# Patient Record
Sex: Female | Born: 1950
Health system: Southern US, Community
[De-identification: ages and names within clinical notes are randomized; demographics above are authoritative.]

## PROBLEM LIST (undated history)

## (undated) DIAGNOSIS — F419 Anxiety disorder, unspecified: Secondary | ICD-10-CM

## (undated) DIAGNOSIS — I10 Essential (primary) hypertension: Secondary | ICD-10-CM

## (undated) HISTORY — PX: TUBAL LIGATION: SHX77

## (undated) HISTORY — DX: Essential (primary) hypertension: I10

## (undated) HISTORY — DX: Anxiety disorder, unspecified: F41.9

---

## 2003-03-12 ENCOUNTER — Emergency Department (HOSPITAL_COMMUNITY): Admission: EM | Admit: 2003-03-12 | Discharge: 2003-03-12 | Payer: Self-pay | Admitting: Emergency Medicine

## 2004-12-07 ENCOUNTER — Ambulatory Visit: Payer: Self-pay | Admitting: Family Medicine

## 2005-03-04 ENCOUNTER — Ambulatory Visit: Payer: Self-pay | Admitting: Family Medicine

## 2005-10-10 ENCOUNTER — Ambulatory Visit: Payer: Self-pay | Admitting: Family Medicine

## 2005-11-09 ENCOUNTER — Ambulatory Visit: Payer: Self-pay | Admitting: Family Medicine

## 2005-11-16 ENCOUNTER — Ambulatory Visit: Payer: Self-pay | Admitting: Family Medicine

## 2005-11-30 ENCOUNTER — Ambulatory Visit: Payer: Self-pay | Admitting: Internal Medicine

## 2005-12-14 ENCOUNTER — Ambulatory Visit: Payer: Self-pay | Admitting: Family Medicine

## 2006-01-18 ENCOUNTER — Ambulatory Visit (HOSPITAL_COMMUNITY): Admission: RE | Admit: 2006-01-18 | Discharge: 2006-01-18 | Payer: Self-pay | Admitting: Neurology

## 2006-02-03 ENCOUNTER — Ambulatory Visit: Payer: Self-pay | Admitting: Family Medicine

## 2007-01-05 ENCOUNTER — Ambulatory Visit: Payer: Self-pay | Admitting: Family Medicine

## 2010-02-01 ENCOUNTER — Ambulatory Visit (HOSPITAL_COMMUNITY): Admission: RE | Admit: 2010-02-01 | Discharge: 2010-02-01 | Payer: Self-pay | Admitting: Family Medicine

## 2010-10-18 ENCOUNTER — Encounter: Payer: Self-pay | Admitting: Family Medicine

## 2018-10-15 DIAGNOSIS — I10 Essential (primary) hypertension: Secondary | ICD-10-CM | POA: Insufficient documentation

## 2018-10-16 ENCOUNTER — Ambulatory Visit (INDEPENDENT_AMBULATORY_CARE_PROVIDER_SITE_OTHER): Payer: Medicare Other | Admitting: Family Medicine

## 2018-10-16 ENCOUNTER — Encounter (INDEPENDENT_AMBULATORY_CARE_PROVIDER_SITE_OTHER): Payer: Self-pay | Admitting: *Deleted

## 2018-10-16 ENCOUNTER — Ambulatory Visit (INDEPENDENT_AMBULATORY_CARE_PROVIDER_SITE_OTHER): Payer: Medicare Other

## 2018-10-16 ENCOUNTER — Encounter: Payer: Self-pay | Admitting: Family Medicine

## 2018-10-16 ENCOUNTER — Ambulatory Visit: Payer: Medicare Other

## 2018-10-16 VITALS — BP 164/95 | HR 88 | Temp 98.0°F | Ht 67.0 in | Wt 85.5 lb

## 2018-10-16 DIAGNOSIS — R634 Abnormal weight loss: Secondary | ICD-10-CM

## 2018-10-16 DIAGNOSIS — R0600 Dyspnea, unspecified: Secondary | ICD-10-CM | POA: Diagnosis not present

## 2018-10-16 DIAGNOSIS — R0602 Shortness of breath: Secondary | ICD-10-CM

## 2018-10-16 DIAGNOSIS — F17210 Nicotine dependence, cigarettes, uncomplicated: Secondary | ICD-10-CM | POA: Insufficient documentation

## 2018-10-16 DIAGNOSIS — Z124 Encounter for screening for malignant neoplasm of cervix: Secondary | ICD-10-CM | POA: Diagnosis not present

## 2018-10-16 DIAGNOSIS — Z780 Asymptomatic menopausal state: Secondary | ICD-10-CM

## 2018-10-16 DIAGNOSIS — R636 Underweight: Secondary | ICD-10-CM | POA: Diagnosis not present

## 2018-10-16 DIAGNOSIS — N760 Acute vaginitis: Secondary | ICD-10-CM | POA: Diagnosis not present

## 2018-10-16 DIAGNOSIS — F17200 Nicotine dependence, unspecified, uncomplicated: Secondary | ICD-10-CM | POA: Insufficient documentation

## 2018-10-16 DIAGNOSIS — Z1211 Encounter for screening for malignant neoplasm of colon: Secondary | ICD-10-CM | POA: Diagnosis not present

## 2018-10-16 DIAGNOSIS — N898 Other specified noninflammatory disorders of vagina: Secondary | ICD-10-CM

## 2018-10-16 DIAGNOSIS — Z01411 Encounter for gynecological examination (general) (routine) with abnormal findings: Secondary | ICD-10-CM | POA: Diagnosis not present

## 2018-10-16 DIAGNOSIS — Z7289 Other problems related to lifestyle: Secondary | ICD-10-CM | POA: Diagnosis not present

## 2018-10-16 DIAGNOSIS — Z7689 Persons encountering health services in other specified circumstances: Secondary | ICD-10-CM

## 2018-10-16 DIAGNOSIS — Z1212 Encounter for screening for malignant neoplasm of rectum: Secondary | ICD-10-CM

## 2018-10-16 DIAGNOSIS — Z1239 Encounter for other screening for malignant neoplasm of breast: Secondary | ICD-10-CM | POA: Diagnosis not present

## 2018-10-16 DIAGNOSIS — I10 Essential (primary) hypertension: Secondary | ICD-10-CM | POA: Diagnosis not present

## 2018-10-16 DIAGNOSIS — B9689 Other specified bacterial agents as the cause of diseases classified elsewhere: Secondary | ICD-10-CM

## 2018-10-16 DIAGNOSIS — Z13820 Encounter for screening for osteoporosis: Secondary | ICD-10-CM | POA: Diagnosis not present

## 2018-10-16 LAB — WET PREP FOR TRICH, YEAST, CLUE
CLUE CELL EXAM: POSITIVE — AB
TRICHOMONAS EXAM: NEGATIVE
YEAST EXAM: NEGATIVE

## 2018-10-16 MED ORDER — METRONIDAZOLE 500 MG PO TABS: 500.0000 mg | ORAL_TABLET | Freq: Two times a day (BID) | ORAL | 0 refills | Status: AC

## 2018-10-16 MED ORDER — LISINOPRIL 20 MG PO TABS: 20.0000 mg | ORAL_TABLET | Freq: Every day | ORAL | 3 refills | Status: DC

## 2018-10-16 NOTE — Patient Instructions (Signed)

## 2018-10-16 NOTE — Progress Notes (Addendum)
Subjective:    Patient ID: Elizabeth Dickerson, female    DOB: 1951/07/04, 68 y.o.   MRN: [id number]  Chief Complaint:  Shortness of Breath (ongoing for the last two years, has been a smoker for 30 years, worse with exertion) and Headache (having back headaches for about 6 months, has been off of BP medications for over a year)   HPI: TNYA ADES is a 68 y.o. female presenting on 10/16/2018 for Shortness of Breath (ongoing for the last two years, has been a smoker for 30 years, worse with exertion) and Headache (having back headaches for about 6 months, has been off of BP medications for over a year)  Pt presents today to establish care. Pt states she has not seen a PCP in several years. States she has been out of her blood pressure medications for over a year. States after stopping her blood pressure medications she developed a headache. States the headache is intermittent and aching in nature. States the pain is mostly in the left side. She denies confusion, focal neuro deficits, weakness, or visual disturbances. She does not have nausea or vomiting with the headaches. She denies chest pain, palpitations, or leg swelling. States she does have intermittent shortness of breath, states this is worse with exertion. She is a current every day smoker. She states she has lost around 30-40 pounds in the last year. States she does not know why she has lost this much weight. She denies fatigue, night sweats, fever, chills, or hemoptysis. She denies recent sick exposures. She reports vaginal discharge for several weeks, states she has pruritis with the discharge. She denies vaginal bleeding or dysuria. She states she has tried over the counter yeast treatments without success.   Relevant past medical, surgical, family, and social history reviewed and updated as indicated.  Allergies and medications reviewed and updated.   Past Medical History:  Diagnosis Date  . Hypertension     Past Surgical History:   Procedure Laterality Date  . TUBAL LIGATION      Social History   Socioeconomic History  . Marital status: Married    Spouse name: Not on file  . Number of children: Not on file  . Years of education: Not on file  . Highest education level: Not on file  Occupational History  . Not on file  Social Needs  . Financial resource strain: Not on file  . Food insecurity:    Worry: Not on file    Inability: Not on file  . Transportation needs:    Medical: Not on file    Non-medical: Not on file  Tobacco Use  . Smoking status: Current Every Day Smoker    Packs/day: 0.25    Years: 30.00    Pack years: 7.50    Types: Cigarettes  . Smokeless tobacco: Never Used  Substance and Sexual Activity  . Alcohol use: Not Currently  . Drug use: Yes    Types: Marijuana  . Sexual activity: Not on file  Lifestyle  . Physical activity:    Days per week: Not on file    Minutes per session: Not on file  . Stress: Not on file  Relationships  . Social connections:    Talks on phone: Not on file    Gets together: Not on file    Attends religious service: Not on file    Active member of club or organization: Not on file    Attends meetings of clubs or organizations:  Not on file    Relationship status: Not on file  . Intimate partner violence:    Fear of current or ex partner: Not on file    Emotionally abused: Not on file    Physically abused: Not on file    Forced sexual activity: Not on file  Other Topics Concern  . Not on file  Social History Narrative  . Not on file    Outpatient Encounter Medications as of 10/16/2018  Medication Sig  . lisinopril (PRINIVIL,ZESTRIL) 20 MG tablet Take 1 tablet (20 mg total) by mouth daily.   No facility-administered encounter medications on file as of 10/16/2018.     No Known Allergies  Review of Systems  Constitutional: Positive for unexpected weight change. Negative for activity change, appetite change, chills, fatigue and fever.  HENT:  Negative for nosebleeds, sore throat, trouble swallowing and voice change.   Eyes: Negative for photophobia and visual disturbance.  Respiratory: Positive for cough and shortness of breath. Negative for chest tightness and wheezing.   Cardiovascular: Negative for chest pain, palpitations and leg swelling.  Gastrointestinal: Negative for abdominal distention, abdominal pain, anal bleeding, blood in stool, constipation, diarrhea, nausea, rectal pain and vomiting.  Genitourinary: Positive for vaginal discharge. Negative for decreased urine volume, difficulty urinating, dysuria, flank pain, genital sores, hematuria, pelvic pain, vaginal bleeding and vaginal pain.  Neurological: Positive for headaches. Negative for dizziness, tremors, seizures, syncope, facial asymmetry, speech difficulty, weakness, light-headedness and numbness.  Psychiatric/Behavioral: Negative for confusion.  All other systems reviewed and are negative.       Objective:    BP (!) 164/95   Pulse 88   Temp 98 F (36.7 C) (Oral)   Ht 5' 7"  (1.702 m)   Wt 85 lb 8 oz (38.8 kg)   SpO2 99%   BMI 13.39 kg/m    Wt Readings from Last 3 Encounters:  10/16/18 85 lb 8 oz (38.8 kg)    Physical Exam Vitals signs and nursing note reviewed. Exam conducted with a chaperone present.  Constitutional:      General: She is not in acute distress.    Appearance: Normal appearance. She is well-groomed and underweight. She is not ill-appearing or toxic-appearing.  HENT:     Head: Normocephalic and atraumatic.     Jaw: There is normal jaw occlusion.     Right Ear: Hearing, tympanic membrane, ear canal and external ear normal.     Left Ear: Hearing, tympanic membrane, ear canal and external ear normal.     Nose: Nose normal.     Mouth/Throat:     Lips: Pink.     Mouth: Mucous membranes are moist.     Pharynx: Oropharynx is clear. Uvula midline.  Eyes:     General: Lids are normal.     Extraocular Movements: Extraocular movements  intact.     Conjunctiva/sclera: Conjunctivae normal.     Pupils: Pupils are equal, round, and reactive to light.     Visual Fields: Right eye visual fields normal and left eye visual fields normal.  Neck:     Musculoskeletal: Full passive range of motion without pain and neck supple.     Thyroid: No thyroid mass, thyromegaly or thyroid tenderness.     Vascular: No carotid bruit or JVD.     Trachea: Trachea and phonation normal.  Cardiovascular:     Rate and Rhythm: Normal rate and regular rhythm.     Chest Wall: PMI is not displaced.     Pulses:  Normal pulses.     Heart sounds: Normal heart sounds. No murmur. No friction rub. No gallop.   Pulmonary:     Effort: Pulmonary effort is normal. No respiratory distress.     Breath sounds: Normal breath sounds.  Chest:     Breasts: Breasts are symmetrical.        Right: Normal.        Left: Normal.  Abdominal:     General: Abdomen is flat. Bowel sounds are normal. There is no distension or abdominal bruit.     Palpations: Abdomen is soft.     Tenderness: There is no abdominal tenderness. There is no right CVA tenderness or left CVA tenderness.     Hernia: No hernia is present. There is no hernia in the right inguinal area or left inguinal area.  Genitourinary:    Exam position: Lithotomy position.     Pubic Area: No rash or pubic lice.      Labia:        Right: No rash, tenderness, lesion or injury.        Left: No rash, tenderness, lesion or injury.      Urethra: No prolapse, urethral pain, urethral swelling or urethral lesion.     Vagina: Vaginal discharge (homogenous malodorous white to ligth gray) present.     Cervix: Discharge and erythema present. No cervical motion tenderness, friability or cervical bleeding.     Uterus: Normal.      Adnexa: Right adnexa normal and left adnexa normal.     Rectum: Normal.  Musculoskeletal: Normal range of motion.     Right lower leg: No edema.     Left lower leg: No edema.  Lymphadenopathy:      Cervical: No cervical adenopathy.     Upper Body:     Right upper body: No supraclavicular, axillary or pectoral adenopathy.     Left upper body: No supraclavicular, axillary or pectoral adenopathy.     Lower Body: No right inguinal adenopathy. No left inguinal adenopathy.  Skin:    General: Skin is warm and dry.     Capillary Refill: Capillary refill takes less than 2 seconds.  Neurological:     General: No focal deficit present.     Mental Status: She is alert and oriented to person, place, and time.     Cranial Nerves: Cranial nerves are intact.     Sensory: Sensation is intact.     Motor: Motor function is intact.     Coordination: Coordination is intact.     Gait: Gait is intact.     Deep Tendon Reflexes: Reflexes are normal and symmetric.  Psychiatric:        Attention and Perception: Attention and perception normal.        Mood and Affect: Mood and affect normal.        Speech: Speech normal.        Behavior: Behavior normal. Behavior is cooperative.        Thought Content: Thought content normal.        Cognition and Memory: Cognition and memory normal.        Judgment: Judgment normal.     No results found for this or any previous visit.    EKG: there are no previous tracings available for comparison, SR with BBB, no ectopy or ST elevation. Monia Pouch, FNP-C  X-Ray: Chest: No acute findings. Preliminary x-ray reading by Monia Pouch, FNP-C, [facility].  Pertinent labs & imaging results that were  available during my care of the patient were reviewed by me and considered in my medical decision making.  Assessment & Plan:  Kaitlen was seen today for shortness of breath and headache.  Diagnoses and all orders for this visit:  Encounter to establish care  Shortness of breath Shortness of breath for 2 years, worsening over the last few months and worse with exertion. Labs, EKG, and chest xray today.  -     EKG 12-Lead -     CMP14+EGFR -     CBC with  Differential/Platelet -     TSH -     Microalbumin / creatinine urine ratio -     DG Chest 2 View; Future  Essential hypertension Has been out of blood pressure medications for over a year. Increased blood pressure and headaches. Labs pending. Lisinopril refilled. Follow up in 3 months. Report any new or worsening symptoms.  -     CMP14+EGFR -     CBC with Differential/Platelet -     Lipid panel -     TSH -     Microalbumin / creatinine urine ratio -     lisinopril (PRINIVIL,ZESTRIL) 20 MG tablet; Take 1 tablet (20 mg total) by mouth daily.  Other problems related to lifestyle -     Hepatitis C antibody  Screening breast examination Will schedule mammogram. Order placed today.  Screening for colorectal cancer Denies previous colonoscopy. Referral placed today.  -     Ambulatory referral to Gastroenterology  Screening for cervical cancer Unsure of last PAP. Will complete today.   Encounter for osteoporosis screening in asymptomatic postmenopausal patient Menopause 25 years ago. Does not exercise on a regular basis. No adequate calcium intake. Has not had a DEXA in the past, order placed today.  -     DG [facility] DEXA  Vaginal discharge Wet prep positive for clue cells, negative for yeast and trich. Vaginal hygiene discussed. Pt aware to discontinue over the counter yeast treatments.  -     WET PREP FOR TRICH, YEAST, CLUE  Weight loss Labs pending and chest xray ordered.  -     TSH -     DG Chest 2 View; Future  Tobacco dependence Smoking cessation encouraged. Shortness of breath and weight loss. Screening chest xray today.  -     DG Chest 2 View; Future  Bacterial vaginosis Vaginal hygiene discussed. Medications as prescribed. Report any new or worsening symptoms.         - Flagyl 500 mg oral, twice daily for 7 days.   Total time spent with patient 60 minutes.  Greater than 50% of encounter spent in coordination of care/counseling.  Continue all other maintenance  medications.  Follow up plan: Return in about 3 months (around 01/15/2019), or if symptoms worsen or fail to improve.  Educational handout given for health maintenance  The above assessment and management plan was discussed with the patient. The patient verbalized understanding of and has agreed to the management plan. Patient is aware to call the clinic if symptoms persist or worsen. Patient is aware when to return to the clinic for a follow-up visit. Patient educated on when it is appropriate to go to the emergency department.   Monia Pouch, FNP-C [facility] [phone]

## 2018-10-16 NOTE — Addendum Note (Signed)
Addended by: Michaela Corner on: 10/16/2018 02:41 PM   Modules accepted: Orders

## 2018-10-17 LAB — CMP14+EGFR
ALK PHOS: 113 IU/L (ref 39–117)
ALT: 11 IU/L (ref 0–32)
AST: 24 IU/L (ref 0–40)
Albumin/Globulin Ratio: 1.7 (ref 1.2–2.2)
Albumin: 4.8 g/dL (ref 3.8–4.8)
BUN/Creatinine Ratio: 17 (ref 12–28)
BUN: 17 mg/dL (ref 8–27)
Bilirubin Total: 0.3 mg/dL (ref 0.0–1.2)
CO2: 22 mmol/L (ref 20–29)
CREATININE: 1 mg/dL (ref 0.57–1.00)
Calcium: 9.6 mg/dL (ref 8.7–10.3)
Chloride: 104 mmol/L (ref 96–106)
GFR calc Af Amer: 68 mL/min/{1.73_m2} (ref >59)
GFR calc non Af Amer: 59 mL/min/{1.73_m2} — ABNORMAL LOW (ref >59)
GLUCOSE: 88 mg/dL (ref 65–99)
Globulin, Total: 2.9 g/dL (ref 1.5–4.5)
Potassium: 4.2 mmol/L (ref 3.5–5.2)
Sodium: 145 mmol/L — ABNORMAL HIGH (ref 134–144)
TOTAL PROTEIN: 7.7 g/dL (ref 6.0–8.5)

## 2018-10-17 LAB — CBC WITH DIFFERENTIAL/PLATELET
Basophils Absolute: 0.1 10*3/uL (ref 0.0–0.2)
Basos: 1 %
EOS (ABSOLUTE): 0.1 10*3/uL (ref 0.0–0.4)
EOS: 2 %
HEMOGLOBIN: 13.4 g/dL (ref 11.1–15.9)
Hematocrit: 40.2 % (ref 34.0–46.6)
IMMATURE GRANS (ABS): 0 10*3/uL (ref 0.0–0.1)
IMMATURE GRANULOCYTES: 0 %
LYMPHS: 41 %
Lymphocytes Absolute: 2.6 10*3/uL (ref 0.7–3.1)
MCH: 27.6 pg (ref 26.6–33.0)
MCHC: 33.3 g/dL (ref 31.5–35.7)
MCV: 83 fL (ref 79–97)
MONOCYTES: 5 %
MONOS ABS: 0.3 10*3/uL (ref 0.1–0.9)
NEUTROS PCT: 51 %
Neutrophils Absolute: 3.2 10*3/uL (ref 1.4–7.0)
Platelets: 342 10*3/uL (ref 150–450)
RBC: 4.86 x10E6/uL (ref 3.77–5.28)
RDW: 13.8 % (ref 11.7–15.4)
WBC: 6.3 10*3/uL (ref 3.4–10.8)

## 2018-10-17 LAB — LIPID PANEL
CHOL/HDL RATIO: 3.6 ratio (ref 0.0–4.4)
Cholesterol, Total: 201 mg/dL — ABNORMAL HIGH (ref 100–199)
HDL: 56 mg/dL (ref >39)
LDL Calculated: 130 mg/dL — ABNORMAL HIGH (ref 0–99)
TRIGLYCERIDES: 74 mg/dL (ref 0–149)
VLDL Cholesterol Cal: 15 mg/dL (ref 5–40)

## 2018-10-17 LAB — HEPATITIS C ANTIBODY: Hep C Virus Ab: <0.1 s/co ratio (ref 0.0–0.9)

## 2018-10-17 LAB — TSH: TSH: 0.566 u[IU]/mL (ref 0.450–4.500)

## 2018-10-17 LAB — MICROALBUMIN / CREATININE URINE RATIO
Creatinine, Urine: 164 mg/dL
MICROALB/CREAT RATIO: 42 mg/g{creat} — AB (ref 0–29)
Microalbumin, Urine: 69.5 ug/mL

## 2018-10-19 LAB — PAP IG W/ RFLX HPV ASCU

## 2019-02-05 ENCOUNTER — Telehealth: Payer: Self-pay | Admitting: *Deleted

## 2019-02-05 NOTE — Telephone Encounter (Signed)
Left message for patient to call back to set up follow up appt with Monia Pouch

## 2019-04-16 ENCOUNTER — Encounter: Payer: Self-pay | Admitting: Family Medicine

## 2019-04-16 ENCOUNTER — Other Ambulatory Visit: Payer: Self-pay

## 2019-04-16 ENCOUNTER — Ambulatory Visit (INDEPENDENT_AMBULATORY_CARE_PROVIDER_SITE_OTHER): Payer: Medicare HMO | Admitting: Family Medicine

## 2019-04-16 DIAGNOSIS — F411 Generalized anxiety disorder: Secondary | ICD-10-CM | POA: Diagnosis not present

## 2019-04-16 DIAGNOSIS — F339 Major depressive disorder, recurrent, unspecified: Secondary | ICD-10-CM | POA: Diagnosis not present

## 2019-04-16 DIAGNOSIS — R69 Illness, unspecified: Secondary | ICD-10-CM | POA: Diagnosis not present

## 2019-04-16 MED ORDER — HYDROXYZINE HCL 10 MG PO TABS: 10.0000 mg | ORAL_TABLET | Freq: Three times a day (TID) | ORAL | 0 refills | Status: DC | PRN

## 2019-04-16 MED ORDER — ESCITALOPRAM OXALATE 5 MG PO TABS: 5.0000 mg | ORAL_TABLET | Freq: Every day | ORAL | 3 refills | Status: DC

## 2019-04-16 NOTE — Progress Notes (Signed)
Virtual Visit via telephone Note Due to COVID-19 pandemic this visit was conducted virtually. This visit type was conducted due to national recommendations for restrictions regarding the COVID-19 Pandemic (e.g. social distancing, sheltering in place) in an effort to limit this patient's exposure and mitigate transmission in our community. All issues noted in this document were discussed and addressed.  A physical exam was not performed with this format.   I connected with Elizabeth Dickerson on 04/16/19 at 1505 by telephone and verified that I am speaking with the correct person using two identifiers. Elizabeth Dickerson is currently located at home and family is currently with them during visit. The provider, Monia Pouch, FNP is located in their office at time of visit.  I discussed the limitations, risks, security and privacy concerns of performing an evaluation and management service by telephone and the availability of in person appointments. I also discussed with the patient that there may be a patient responsible charge related to this service. The patient expressed understanding and agreed to proceed.  Subjective:  Patient ID: Elizabeth Dickerson, female    DOB: Oct 04, 1950, 68 y.o.   MRN: [id number]  Chief Complaint:  Anxiety   HPI: Elizabeth Dickerson is a 68 y.o. female presenting on 04/16/2019 for Anxiety   Pt reports increasing anxiety and depression since the onset of the COVID-19 pandemic. Pt states she has severe anxiety when she has to go to the store or out in public. States it is almost to the point of panic. Pt states her depression seems to be bothering her more also. States she has some days where she does not want to get out of the bed. Pt states she has been on "blues" before for her anxiety and feels she needs this at this time. Pt has never tried daily medications for her anxiety or depression.   Anxiety Presents for initial visit. Onset was 6 to 12 months ago. The problem has been  waxing and waning. Symptoms include decreased concentration, depressed mood, dizziness, dry mouth, excessive worry, insomnia, irritability, nervous/anxious behavior, palpitations and panic. Patient reports no chest pain, compulsions, confusion, feeling of choking, hyperventilation, impotence, malaise, muscle tension, nausea, obsessions, restlessness, shortness of breath or suicidal ideas. Symptoms occur occasionally. The severity of symptoms is moderate. The symptoms are aggravated by social activities. The quality of sleep is fair. Nighttime awakenings: occasional.   Past treatments include benzodiazephines. Compliance with prior treatments has been good.     Relevant past medical, surgical, family, and social history reviewed and updated as indicated.  Allergies and medications reviewed and updated.   Past Medical History:  Diagnosis Date  . Hypertension     Past Surgical History:  Procedure Laterality Date  . TUBAL LIGATION      Social History   Socioeconomic History  . Marital status: Married    Spouse name: Not on file  . Number of children: Not on file  . Years of education: Not on file  . Highest education level: Not on file  Occupational History  . Not on file  Social Needs  . Financial resource strain: Not on file  . Food insecurity    Worry: Not on file    Inability: Not on file  . Transportation needs    Medical: Not on file    Non-medical: Not on file  Tobacco Use  . Smoking status: Current Every Day Smoker    Packs/day: 0.25    Years: 30.00    Pack years:  7.50    Types: Cigarettes  . Smokeless tobacco: Never Used  Substance and Sexual Activity  . Alcohol use: Not Currently  . Drug use: Yes    Types: Marijuana  . Sexual activity: Not on file  Lifestyle  . Physical activity    Days per week: Not on file    Minutes per session: Not on file  . Stress: Not on file  Relationships  . Social Herbalist on phone: Not on file    Gets together:  Not on file    Attends religious service: Not on file    Active member of club or organization: Not on file    Attends meetings of clubs or organizations: Not on file    Relationship status: Not on file  . Intimate partner violence    Fear of current or ex partner: Not on file    Emotionally abused: Not on file    Physically abused: Not on file    Forced sexual activity: Not on file  Other Topics Concern  . Not on file  Social History Narrative  . Not on file    Outpatient Encounter Medications as of 04/16/2019  Medication Sig  . escitalopram (LEXAPRO) 5 MG tablet Take 1 tablet (5 mg total) by mouth daily.  . hydrOXYzine (ATARAX/VISTARIL) 10 MG tablet Take 1 tablet (10 mg total) by mouth 3 (three) times daily as needed.  Marland Kitchen lisinopril (PRINIVIL,ZESTRIL) 20 MG tablet Take 1 tablet (20 mg total) by mouth daily.   No facility-administered encounter medications on file as of 04/16/2019.     No Known Allergies  Review of Systems  Constitutional: Positive for activity change, fatigue and irritability. Negative for appetite change, chills, diaphoresis, fever and unexpected weight change.  Respiratory: Negative for cough, chest tightness, shortness of breath and wheezing.   Cardiovascular: Positive for palpitations. Negative for chest pain and leg swelling.  Gastrointestinal: Negative for abdominal pain, diarrhea, nausea and vomiting.  Genitourinary: Negative for impotence.  Musculoskeletal: Negative for arthralgias and myalgias.  Neurological: Positive for dizziness. Negative for syncope, weakness, light-headedness and headaches.  Psychiatric/Behavioral: Positive for agitation, decreased concentration, dysphoric mood and sleep disturbance. Negative for behavioral problems, confusion, hallucinations, self-injury and suicidal ideas. The patient is nervous/anxious and has insomnia. The patient is not hyperactive.   All other systems reviewed and are negative.         Observations/Objective: No vital signs or physical exam, this was a telephone or virtual health encounter.  Pt alert and oriented, answers all questions appropriately, and able to speak in full sentences.    Assessment and Plan: Inna was seen today for anxiety.  Diagnoses and all orders for this visit:  GAD (generalized anxiety disorder) Ongoing and worsening anxiety. Severe when in public. Will initiate daily Lexapro and PRN atarax. Sedation precautions for Atarax provided. Pt requesting Xanax. Pt aware this is not the first line treatment for anxiety and not appropriate for use in the elder population. Pt aware of the importance of medication compliance. Follow up in 4 weeks for reevaluation.  -     hydrOXYzine (ATARAX/VISTARIL) 10 MG tablet; Take 1 tablet (10 mg total) by mouth 3 (three) times daily as needed. -     escitalopram (LEXAPRO) 5 MG tablet; Take 1 tablet (5 mg total) by mouth daily.  Depression, recurrent ([place]) Ongoing and worsening depression. Will initiate Lexapro daily. Pt aware to report any new or worsening symptoms. Follow up in 4 weeks for reevaluation.  -  escitalopram (LEXAPRO) 5 MG tablet; Take 1 tablet (5 mg total) by mouth daily.     Follow Up Instructions: Return in about 4 weeks (around 05/14/2019), or if symptoms worsen or fail to improve, for anxiety.    I discussed the assessment and treatment plan with the patient. The patient was provided an opportunity to ask questions and all were answered. The patient agreed with the plan and demonstrated an understanding of the instructions.   The patient was advised to call back or seek an in-person evaluation if the symptoms worsen or if the condition fails to improve as anticipated.  The above assessment and management plan was discussed with the patient. The patient verbalized understanding of and has agreed to the management plan. Patient is aware to call the clinic if symptoms persist or worsen. Patient is  aware when to return to the clinic for a follow-up visit. Patient educated on when it is appropriate to go to the emergency department.    I provided 25 minutes of non-face-to-face time during this encounter. The call started at 1505. The call ended at 1530. The other time was used for coordination of care.    Monia Pouch, FNP-C [facility] [address] [place], [place] [address] [phone] 04/16/19

## 2019-10-28 ENCOUNTER — Ambulatory Visit (INDEPENDENT_AMBULATORY_CARE_PROVIDER_SITE_OTHER): Payer: Medicare HMO | Admitting: Family Medicine

## 2019-10-28 ENCOUNTER — Other Ambulatory Visit: Payer: Self-pay

## 2019-10-28 ENCOUNTER — Ambulatory Visit (INDEPENDENT_AMBULATORY_CARE_PROVIDER_SITE_OTHER): Payer: Medicare HMO

## 2019-10-28 ENCOUNTER — Encounter: Payer: Self-pay | Admitting: Family Medicine

## 2019-10-28 VITALS — BP 160/94 | HR 98 | Temp 98.4°F | Resp 20 | Ht 67.0 in | Wt 82.0 lb

## 2019-10-28 DIAGNOSIS — M810 Age-related osteoporosis without current pathological fracture: Secondary | ICD-10-CM

## 2019-10-28 DIAGNOSIS — Z1231 Encounter for screening mammogram for malignant neoplasm of breast: Secondary | ICD-10-CM | POA: Diagnosis not present

## 2019-10-28 DIAGNOSIS — R69 Illness, unspecified: Secondary | ICD-10-CM | POA: Diagnosis not present

## 2019-10-28 DIAGNOSIS — J449 Chronic obstructive pulmonary disease, unspecified: Secondary | ICD-10-CM | POA: Diagnosis not present

## 2019-10-28 DIAGNOSIS — I10 Essential (primary) hypertension: Secondary | ICD-10-CM

## 2019-10-28 DIAGNOSIS — F411 Generalized anxiety disorder: Secondary | ICD-10-CM

## 2019-10-28 DIAGNOSIS — R634 Abnormal weight loss: Secondary | ICD-10-CM | POA: Diagnosis not present

## 2019-10-28 DIAGNOSIS — Z1212 Encounter for screening for malignant neoplasm of rectum: Secondary | ICD-10-CM

## 2019-10-28 DIAGNOSIS — Z13820 Encounter for screening for osteoporosis: Secondary | ICD-10-CM | POA: Diagnosis not present

## 2019-10-28 DIAGNOSIS — F17200 Nicotine dependence, unspecified, uncomplicated: Secondary | ICD-10-CM | POA: Diagnosis not present

## 2019-10-28 DIAGNOSIS — Z1211 Encounter for screening for malignant neoplasm of colon: Secondary | ICD-10-CM | POA: Diagnosis not present

## 2019-10-28 DIAGNOSIS — Z79899 Other long term (current) drug therapy: Secondary | ICD-10-CM | POA: Diagnosis not present

## 2019-10-28 DIAGNOSIS — K219 Gastro-esophageal reflux disease without esophagitis: Secondary | ICD-10-CM

## 2019-10-28 DIAGNOSIS — F339 Major depressive disorder, recurrent, unspecified: Secondary | ICD-10-CM

## 2019-10-28 DIAGNOSIS — M8588 Other specified disorders of bone density and structure, other site: Secondary | ICD-10-CM | POA: Diagnosis not present

## 2019-10-28 MED ORDER — OMEPRAZOLE 20 MG PO CPDR: 20.0000 mg | DELAYED_RELEASE_CAPSULE | Freq: Every day | ORAL | 3 refills | Status: DC

## 2019-10-28 MED ORDER — AMLODIPINE BESYLATE 5 MG PO TABS: 5.0000 mg | ORAL_TABLET | Freq: Every day | ORAL | 3 refills | Status: DC

## 2019-10-28 NOTE — Progress Notes (Signed)
Subjective:  Patient ID: Elizabeth Dickerson, female    DOB: 11-Dec-1950, 69 y.o.   MRN: [id number]  Patient Care Team: Baruch Gouty, FNP as PCP - General (Family Medicine)   Chief Complaint:  Weight Loss and Gastroesophageal Reflux   HPI: Elizabeth Dickerson is a 69 y.o. female presenting on 10/28/2019 for Weight Loss and Gastroesophageal Reflux   1. Essential hypertension Complaint with meds - Yes Current Medications - Norvasc, lisinopril  Checking BP at home - No Exercising Regularly - No Watching Salt intake - No Pertinent ROS:  Headache - No Fatigue - Yes Visual Disturbances - No Chest pain - No Dyspnea - No Palpitations - No LE edema - No They report good compliance with medications and can restate their regimen by memory. No medication side effects.  Family, social, and smoking history reviewed.   BP Readings from Last 3 Encounters:  10/28/19 (!) 160/94  10/16/18 (!) 164/95   CMP Latest Ref Rng & Units 10/16/2018  Glucose 65 - 99 mg/dL 88  BUN 8 - 27 mg/dL 17  Creatinine 0.57 - 1.00 mg/dL 1.00  Sodium 134 - 144 mmol/L 145(H)  Potassium 3.5 - 5.2 mmol/L 4.2  Chloride 96 - 106 mmol/L 104  CO2 20 - 29 mmol/L 22  Calcium 8.7 - 10.3 mg/dL 9.6  Total Protein 6.0 - 8.5 g/dL 7.7  Total Bilirubin 0.0 - 1.2 mg/dL 0.3  Alkaline Phos 39 - 117 IU/L 113  AST 0 - 40 IU/L 24  ALT 0 - 32 IU/L 11      2. Depression, recurrent ([place]) 3. GAD (generalized anxiety disorder) Pt has been taking Lexapro as prescribed as reports doing well. States she has increased anxiety if she has to go to [employer]. She denies the need for atarax. No SI or HI.  Depression screen [facility] 2/9 10/28/2019 10/28/2019 04/16/2019 10/16/2018  Decreased Interest 0 0 1 2  Down, Depressed, Hopeless 0 0 1 3  PHQ - 2 Score 0 0 2 5  Altered sleeping 0 - 1 2  Tired, decreased energy 0 - - 3  Change in appetite 0 - 0 3  Feeling bad or failure about yourself  0 - 0 1  Trouble concentrating 0 - 1 3  Moving slowly or  fidgety/restless 0 - 0 1  Suicidal thoughts 0 - 0 0  PHQ-9 Score 0 - 4 18    4. Weight loss Pt reports ongoing weight loss since 2016. Only recorded weight in EHR was from 2007 and was 120lbs. Most recent OV on 10/16/2018 with a weight of 38.8 kg. Pt states she does have fatigue with the weight loss. No fever, chills, weakness, or confusion. States she does not have a great appetite and does not eat much daily. States she only has 1-2 BM per week. States this has been her normal for several years. She denies melena or hematochezia. No hemoptysis, nausea, vomiting, or abdominal pain. Denies ETOH use at present. States she does smoke marijuana at times.   5. Tobacco dependence Current smoker, 7.5 pack years. Has cut back to 14 cigarettes per week. States this has been hard but she is working to quit all together. She does have a cough at times that is nonproductive.   6. Gastroesophageal reflux disease without esophagitis Compliant with medications - Yes Current medications - omeprazole Adverse side effects - No DEXA ordered today Cough - Yes at times Sore throat - No Voice change - No Hemoptysis -  No Dysphagia or dyspepsia - No Water brash - No Red Flags (weight loss, hematochezia, melena, weight loss, early satiety, fevers, odynophagia, or persistent vomiting) - yes, weight loss, early satiety    Relevant past medical, surgical, family, and social history reviewed and updated as indicated.  Allergies and medications reviewed and updated. Date reviewed: Chart in Epic.   Past Medical History:  Diagnosis Date  . Anxiety    panic attack   . Hypertension     Past Surgical History:  Procedure Laterality Date  . TUBAL LIGATION      Social History   Socioeconomic History  . Marital status: Married    Spouse name: Not on file  . Number of children: Not on file  . Years of education: Not on file  . Highest education level: Not on file  Occupational History  . Not on file    Tobacco Use  . Smoking status: Current Every Day Smoker    Packs/day: 0.25    Years: 30.00    Pack years: 7.50    Types: Cigarettes  . Smokeless tobacco: Never Used  Substance and Sexual Activity  . Alcohol use: Not Currently  . Drug use: Yes    Types: Marijuana  . Sexual activity: Not on file  Other Topics Concern  . Not on file  Social History Narrative  . Not on file   Social Determinants of Health   Financial Resource Strain:   . Difficulty of Paying Living Expenses: Not on file  Food Insecurity:   . Worried About Charity fundraiser in the Last Year: Not on file  . Ran Out of Food in the Last Year: Not on file  Transportation Needs:   . Lack of Transportation (Medical): Not on file  . Lack of Transportation (Non-Medical): Not on file  Physical Activity:   . Days of Exercise per Week: Not on file  . Minutes of Exercise per Session: Not on file  Stress:   . Feeling of Stress : Not on file  Social Connections:   . Frequency of Communication with Friends and Family: Not on file  . Frequency of Social Gatherings with Friends and Family: Not on file  . Attends Religious Services: Not on file  . Active Member of Clubs or Organizations: Not on file  . Attends Archivist Meetings: Not on file  . Marital Status: Not on file  Intimate Partner Violence:   . Fear of Current or Ex-Partner: Not on file  . Emotionally Abused: Not on file  . Physically Abused: Not on file  . Sexually Abused: Not on file    Outpatient Encounter Medications as of 10/28/2019  Medication Sig  . escitalopram (LEXAPRO) 5 MG tablet Take 1 tablet (5 mg total) by mouth daily.  Marland Kitchen lisinopril (PRINIVIL,ZESTRIL) 20 MG tablet Take 1 tablet (20 mg total) by mouth daily.  Marland Kitchen amLODipine (NORVASC) 5 MG tablet Take 1 tablet (5 mg total) by mouth daily.  . hydrOXYzine (ATARAX/VISTARIL) 10 MG tablet Take 1 tablet (10 mg total) by mouth 3 (three) times daily as needed. (Patient not taking: Reported on  10/28/2019)  . omeprazole (PRILOSEC) 20 MG capsule Take 1 capsule (20 mg total) by mouth daily.   No facility-administered encounter medications on file as of 10/28/2019.    No Known Allergies  Review of Systems  Constitutional: Positive for appetite change, fatigue and unexpected weight change. Negative for activity change, chills, diaphoresis and fever.  HENT: Negative.   Eyes: Negative.  Negative for photophobia and visual disturbance.  Respiratory: Negative for cough, chest tightness and shortness of breath.   Cardiovascular: Negative for chest pain, palpitations and leg swelling.  Gastrointestinal: Positive for constipation. Negative for abdominal distention, abdominal pain, anal bleeding, blood in stool, diarrhea, nausea, rectal pain and vomiting.  Endocrine: Negative.  Negative for cold intolerance, heat intolerance, polydipsia, polyphagia and polyuria.  Genitourinary: Negative for decreased urine volume, difficulty urinating, dysuria, flank pain, frequency, hematuria, urgency and vaginal bleeding.  Musculoskeletal: Negative for arthralgias and myalgias.  Skin: Negative.   Allergic/Immunologic: Negative.   Neurological: Negative for dizziness, tremors, seizures, syncope, facial asymmetry, speech difficulty, weakness, light-headedness, numbness and headaches.  Hematological: Negative.  Negative for adenopathy. Does not bruise/bleed easily.  Psychiatric/Behavioral: Negative for confusion, hallucinations, sleep disturbance and suicidal ideas.  All other systems reviewed and are negative.       Objective:  BP (!) 160/94 (BP Location: Left Arm, Cuff Size: Small)   Pulse 98   Temp 98.4 F (36.9 C)   Resp 20   Ht 5' 7"  (1.702 m)   Wt 82 lb (37.2 kg)   SpO2 99%   BMI 12.84 kg/m    Wt Readings from Last 3 Encounters:  10/28/19 82 lb (37.2 kg)  10/16/18 85 lb 8 oz (38.8 kg)    Physical Exam Vitals and nursing note reviewed.  Constitutional:      General: She is not in acute  distress.    Appearance: Normal appearance. She is well-developed, well-groomed and underweight. She is not ill-appearing, toxic-appearing or diaphoretic.  HENT:     Head: Normocephalic and atraumatic.     Jaw: There is normal jaw occlusion.     Right Ear: Hearing normal.     Left Ear: Hearing normal.     Nose: Nose normal.     Mouth/Throat:     Lips: Pink.     Mouth: Mucous membranes are moist.     Pharynx: Oropharynx is clear. Uvula midline.  Eyes:     General: Lids are normal.     Extraocular Movements: Extraocular movements intact.     Conjunctiva/sclera: Conjunctivae normal.     Pupils: Pupils are equal, round, and reactive to light.  Neck:     Thyroid: No thyroid mass, thyromegaly or thyroid tenderness.     Vascular: No carotid bruit or JVD.     Trachea: Trachea and phonation normal.  Cardiovascular:     Rate and Rhythm: Normal rate and regular rhythm.     Chest Wall: PMI is not displaced.     Pulses: Normal pulses.     Heart sounds: Normal heart sounds. No murmur. No friction rub. No gallop.   Pulmonary:     Effort: Pulmonary effort is normal. No respiratory distress.     Breath sounds: Normal breath sounds. No wheezing.  Abdominal:     General: Abdomen is flat. Bowel sounds are normal. There is no distension or abdominal bruit.     Palpations: Abdomen is soft. There is no hepatomegaly or splenomegaly.     Tenderness: There is no abdominal tenderness. There is no right CVA tenderness or left CVA tenderness.     Hernia: No hernia is present.  Musculoskeletal:        General: Normal range of motion.     Cervical back: Normal range of motion and neck supple.     Right lower leg: No edema.     Left lower leg: No edema.  Lymphadenopathy:  Cervical: No cervical adenopathy.  Skin:    General: Skin is warm and dry.     Capillary Refill: Capillary refill takes less than 2 seconds.     Coloration: Skin is not cyanotic, jaundiced or pale.     Findings: No rash.    Neurological:     General: No focal deficit present.     Mental Status: She is alert and oriented to person, place, and time.     Cranial Nerves: Cranial nerves are intact. No cranial nerve deficit.     Sensory: Sensation is intact. No sensory deficit.     Motor: Motor function is intact. No weakness.     Coordination: Coordination is intact. Coordination normal.     Gait: Gait is intact. Gait normal.     Deep Tendon Reflexes: Reflexes are normal and symmetric. Reflexes normal.  Psychiatric:        Attention and Perception: Attention and perception normal.        Mood and Affect: Mood and affect normal.        Speech: Speech normal.        Behavior: Behavior normal. Behavior is cooperative.        Thought Content: Thought content normal.        Cognition and Memory: Cognition and memory normal.        Judgment: Judgment normal.     Results for orders placed or performed in visit on 10/16/18  WET PREP FOR [place], YEAST, CLUE   Specimen: Vaginal Fluid   VAGINAL FLUI  Result Value Ref Range   Trichomonas Exam Negative Negative   Yeast Exam Negative Negative   Clue Cell Exam Positive (A) Negative  CMP14+EGFR  Result Value Ref Range   Glucose 88 65 - 99 mg/dL   BUN 17 8 - 27 mg/dL   Creatinine, Ser 1.00 0.57 - 1.00 mg/dL   GFR calc non Af Amer 59 (L) >59 mL/min/1.73   GFR calc Af Amer 68 >59 mL/min/1.73   BUN/Creatinine Ratio 17 12 - 28   Sodium 145 (H) 134 - 144 mmol/L   Potassium 4.2 3.5 - 5.2 mmol/L   Chloride 104 96 - 106 mmol/L   CO2 22 20 - 29 mmol/L   Calcium 9.6 8.7 - 10.3 mg/dL   Total Protein 7.7 6.0 - 8.5 g/dL   Albumin 4.8 3.8 - 4.8 g/dL   Globulin, Total 2.9 1.5 - 4.5 g/dL   Albumin/Globulin Ratio 1.7 1.2 - 2.2   Bilirubin Total 0.3 0.0 - 1.2 mg/dL   Alkaline Phosphatase 113 39 - 117 IU/L   AST 24 0 - 40 IU/L   ALT 11 0 - 32 IU/L  CBC with Differential/Platelet  Result Value Ref Range   WBC 6.3 3.4 - 10.8 x10E3/uL   RBC 4.86 3.77 - 5.28 x10E6/uL    Hemoglobin 13.4 11.1 - 15.9 g/dL   Hematocrit 40.2 34.0 - 46.6 %   MCV 83 79 - 97 fL   MCH 27.6 26.6 - 33.0 pg   MCHC 33.3 31.5 - 35.7 g/dL   RDW 13.8 11.7 - 15.4 %   Platelets 342 150 - 450 x10E3/uL   Neutrophils 51 Not Estab. %   Lymphs 41 Not Estab. %   Monocytes 5 Not Estab. %   Eos 2 Not Estab. %   Basos 1 Not Estab. %   Neutrophils Absolute 3.2 1.4 - 7.0 x10E3/uL   Lymphocytes Absolute 2.6 0.7 - 3.1 x10E3/uL   Monocytes Absolute 0.3 0.1 -  0.9 x10E3/uL   EOS (ABSOLUTE) 0.1 0.0 - 0.4 x10E3/uL   Basophils Absolute 0.1 0.0 - 0.2 x10E3/uL   Immature Granulocytes 0 Not Estab. %   Immature Grans (Abs) 0.0 0.0 - 0.1 x10E3/uL  Lipid panel  Result Value Ref Range   Cholesterol, Total 201 (H) 100 - 199 mg/dL   Triglycerides 74 0 - 149 mg/dL   HDL 56 >39 mg/dL   VLDL Cholesterol Cal 15 5 - 40 mg/dL   LDL Calculated 130 (H) 0 - 99 mg/dL   Chol/HDL Ratio 3.6 0.0 - 4.4 ratio  TSH  Result Value Ref Range   TSH 0.566 0.450 - 4.500 uIU/mL  Microalbumin / creatinine urine ratio  Result Value Ref Range   Creatinine, Urine 164.0 Not Estab. mg/dL   Microalbumin, Urine 69.5 Not Estab. ug/mL   Microalb/Creat Ratio 42 (H) 0 - 29 mg/g creat  Hepatitis C antibody  Result Value Ref Range   Hep C Virus Ab <0.1 0.0 - 0.9 s/co ratio  Pap IG w/ reflex to HPV when ASC-U  Result Value Ref Range   Interpretation NILM,QC    Category NIL    [facility] ENDO    Clinician Provided ICD10 Comment    Performed by: Comment    QC reviewed by: Comment    Note: Comment    Test Methodology Comment    PAP Reflex Comment      X-Ray: Chest: No acute findings. Preliminary x-ray reading by Monia Pouch, FNP-C, [facility].   Pertinent labs & imaging results that were available during my care of the patient were reviewed by me and considered in my medical decision making.  Assessment & Plan:  Shaneca was seen today for weight loss and gastroesophageal reflux.  Diagnoses and all orders for this visit:  Essential  hypertension BP poorly controlled. Changes were not made in regimen today. Goal BP is 130/80. Pt aware to report any persistent high or low readings. DASH diet and exercise encouraged. Exercise at least 150 minutes per week and increase as tolerated. Goal BMI > 25. Stress management encouraged. Avoid nicotine and tobacco product use. Avoid excessive alcohol and NSAID's. Avoid more than 2000 mg of sodium daily. Medications as prescribed. Follow up as scheduled. Will adjust therapy if BP remains elevated.  -     CBC with Differential/Platelet -     CMP14+EGFR -     Thyroid Panel With TSH -     Lipid panel -     amLODipine (NORVASC) 5 MG tablet; Take 1 tablet (5 mg total) by mouth daily.  Depression, recurrent ([place]) GAD (generalized anxiety disorder) Doing well on current medications. Will continue.  -     Thyroid Panel With TSH  Weight loss Reported ongoing weight loss. Only 2 pound loss since last visit. Will check below and refer to GI for possible EGD and for colonoscopy. Labs pending. Pt encouraged to drink at least one ensure daily and to eat regular meals.  -     DG Chest 2 View; Future -     Thyroid Panel With TSH -     VITAMIN D 25 Hydroxy (Vit-D Deficiency, Fractures) -     Ambulatory referral to Gastroenterology  Tobacco dependence CXR without acute changes. Will notify pt if radiology reading differs.  -     DG Chest 2 View; Future  Gastroesophageal reflux disease without esophagitis Diet discussed. Avoid fried, spicy, fatty, greasy, and acidic foods. Avoid caffeine, nicotine, and alcohol. Do not eat 2-3 hours  before bedtime and stay upright for at least 1-2 hours after eating. Eat small frequent meals. Avoid NSAID's like motrin and aleve. Medications as prescribed. Report any new or worsening symptoms. Follow up as discussed or sooner if needed.   -     CBC with Differential/Platelet -     omeprazole (PRILOSEC) 20 MG capsule; Take 1 capsule (20 mg total) by mouth daily. -      Ambulatory referral to Gastroenterology  Screening for colorectal cancer -     Ambulatory referral to Gastroenterology  Encounter for screening mammogram for malignant neoplasm of breast -     MM 3D SCREEN BREAST BILATERAL; Future  Screening for osteoporosis -     DG [facility] DEXA   Total time spent with patient 50 minutes.  Greater than 50% of encounter spent in coordination of care/counseling.   Continue all other maintenance medications.  Follow up plan: Return in about 4 weeks (around 11/25/2019), or if symptoms worsen or fail to improve, for BP, repeat labs.  Continue healthy lifestyle choices, including diet (rich in fruits, vegetables, and lean proteins, and low in salt and simple carbohydrates) and exercise (at least 30 minutes of moderate physical activity daily).  Educational handout given for survey  The above assessment and management plan was discussed with the patient. The patient verbalized understanding of and has agreed to the management plan. Patient is aware to call the clinic if they develop any new symptoms or if symptoms persist or worsen. Patient is aware when to return to the clinic for a follow-up visit. Patient educated on when it is appropriate to go to the emergency department.   Monia Pouch, FNP-C [facility] [phone]

## 2019-10-28 NOTE — Patient Instructions (Addendum)
It was a pleasure seeing you today, Ms. Scaturro.  Information regarding what we discussed is included in this packet.  Please make an appointment to see me in 3 months.    If you had labs performed today, you will be contacted with the abnormal results once they are available, usually in the next 3 business days for routine lab work.  If you had STI testing, a pap smear, or a biopsy performed, expect to be contacted in about 7-10 days. If results are normal, you will not be notified.    In a few days you may receive a survey in the mail or online from [employer] regarding your visit with [place] today. Please take a moment to fill this out. Your feedback is very important to our office. It can help [place] better understand your needs as well as improve your experience and satisfaction. Thank you for taking your time to complete it. We care about you.  Because of recent events of COVID-19 ("Coronavirus"), please follow CDC recommendations:   1. Wash your hand frequently 2. Avoid touching your face 3. Stay away from people who are sick 4. If you have symptoms such as fever, cough, shortness of breath then call your healthcare provider for further guidance 5. If you are sick, STAY AT HOME, unless otherwise directed by your healthcare provider. 6. Follow directions from state and national officials regarding staying safe    Please feel free to call our office if any questions or concerns arise.  Warm Regards, Monia Pouch, FNP-C [facility] [address] [place], [place] [address] [phone]

## 2019-10-29 ENCOUNTER — Encounter: Payer: Self-pay | Admitting: Gastroenterology

## 2019-10-29 LAB — CMP14+EGFR
ALT: 9 IU/L (ref 0–32)
AST: 21 IU/L (ref 0–40)
Albumin/Globulin Ratio: 1.9 (ref 1.2–2.2)
Albumin: 4.7 g/dL (ref 3.8–4.8)
Alkaline Phosphatase: 104 IU/L (ref 39–117)
BUN/Creatinine Ratio: 15 (ref 12–28)
BUN: 16 mg/dL (ref 8–27)
Bilirubin Total: 0.3 mg/dL (ref 0.0–1.2)
CO2: 24 mmol/L (ref 20–29)
Calcium: 9.7 mg/dL (ref 8.7–10.3)
Chloride: 104 mmol/L (ref 96–106)
Creatinine, Ser: 1.07 mg/dL — ABNORMAL HIGH (ref 0.57–1.00)
GFR calc Af Amer: 63 mL/min/{1.73_m2} (ref >59)
GFR calc non Af Amer: 54 mL/min/{1.73_m2} — ABNORMAL LOW (ref >59)
Globulin, Total: 2.5 g/dL (ref 1.5–4.5)
Glucose: 124 mg/dL — ABNORMAL HIGH (ref 65–99)
Potassium: 3.7 mmol/L (ref 3.5–5.2)
Sodium: 143 mmol/L (ref 134–144)
Total Protein: 7.2 g/dL (ref 6.0–8.5)

## 2019-10-29 LAB — CBC WITH DIFFERENTIAL/PLATELET
Basophils Absolute: 0.1 10*3/uL (ref 0.0–0.2)
Basos: 1 %
EOS (ABSOLUTE): 0.1 10*3/uL (ref 0.0–0.4)
Eos: 2 %
Hematocrit: 43.4 % (ref 34.0–46.6)
Hemoglobin: 14.3 g/dL (ref 11.1–15.9)
Immature Grans (Abs): 0 10*3/uL (ref 0.0–0.1)
Immature Granulocytes: 0 %
Lymphocytes Absolute: 2.7 10*3/uL (ref 0.7–3.1)
Lymphs: 49 %
MCH: 27.7 pg (ref 26.6–33.0)
MCHC: 32.9 g/dL (ref 31.5–35.7)
MCV: 84 fL (ref 79–97)
Monocytes Absolute: 0.2 10*3/uL (ref 0.1–0.9)
Monocytes: 4 %
Neutrophils Absolute: 2.5 10*3/uL (ref 1.4–7.0)
Neutrophils: 44 %
Platelets: 261 10*3/uL (ref 150–450)
RBC: 5.17 x10E6/uL (ref 3.77–5.28)
RDW: 14 % (ref 11.7–15.4)
WBC: 5.6 10*3/uL (ref 3.4–10.8)

## 2019-10-29 LAB — THYROID PANEL WITH TSH
Free Thyroxine Index: 1.6 (ref 1.2–4.9)
T3 Uptake Ratio: 24 % (ref 24–39)
T4, Total: 6.6 ug/dL (ref 4.5–12.0)
TSH: 0.961 u[IU]/mL (ref 0.450–4.500)

## 2019-10-29 LAB — LIPID PANEL
Chol/HDL Ratio: 3.3 ratio (ref 0.0–4.4)
Cholesterol, Total: 185 mg/dL (ref 100–199)
HDL: 56 mg/dL (ref >39)
LDL Chol Calc (NIH): 112 mg/dL — ABNORMAL HIGH (ref 0–99)
Triglycerides: 93 mg/dL (ref 0–149)
VLDL Cholesterol Cal: 17 mg/dL (ref 5–40)

## 2019-10-29 LAB — VITAMIN D 25 HYDROXY (VIT D DEFICIENCY, FRACTURES): Vit D, 25-Hydroxy: 30.4 ng/mL (ref 30.0–100.0)

## 2019-10-29 MED ORDER — ALENDRONATE SODIUM 70 MG PO TABS
70.0000 mg | ORAL_TABLET | ORAL | 11 refills | Status: DC
Start: 2019-10-29 — End: 2021-02-05

## 2019-10-29 MED ORDER — OSCAL 500/200 D-3 500-200 MG-UNIT PO TABS: 2.0000 | ORAL_TABLET | Freq: Every day | ORAL | 4 refills | Status: DC

## 2019-10-29 NOTE — Addendum Note (Signed)
Addended by: Baruch Gouty on: 10/29/2019 12:54 PM   Modules accepted: Orders

## 2019-11-05 DIAGNOSIS — Z1231 Encounter for screening mammogram for malignant neoplasm of breast: Secondary | ICD-10-CM | POA: Diagnosis not present

## 2019-11-20 ENCOUNTER — Ambulatory Visit: Payer: Self-pay | Admitting: Gastroenterology

## 2019-11-22 ENCOUNTER — Other Ambulatory Visit: Payer: Self-pay

## 2019-11-25 ENCOUNTER — Other Ambulatory Visit: Payer: Self-pay

## 2019-11-25 ENCOUNTER — Ambulatory Visit (INDEPENDENT_AMBULATORY_CARE_PROVIDER_SITE_OTHER): Payer: Medicare Other | Admitting: Family Medicine

## 2019-11-25 ENCOUNTER — Encounter: Payer: Self-pay | Admitting: Family Medicine

## 2019-11-25 VITALS — BP 131/77 | HR 84 | Temp 98.7°F | Resp 20 | Ht 67.0 in | Wt 82.0 lb

## 2019-11-25 DIAGNOSIS — I10 Essential (primary) hypertension: Secondary | ICD-10-CM

## 2019-11-25 NOTE — Patient Instructions (Signed)

## 2019-11-25 NOTE — Progress Notes (Signed)
Subjective:    Patient here for follow-up of elevated blood pressure.  She is not exercising and is adherent to a low-salt diet.  Blood pressure is well controlled at home. Cardiac symptoms: none. Patient denies: chest pain, chest pressure/discomfort, claudication, dyspnea, exertional chest pressure/discomfort, fatigue, irregular heart beat, lower extremity edema, near-syncope, orthopnea, palpitations, paroxysmal nocturnal dyspnea, syncope and tachypnea. Cardiovascular risk factors: advanced age (older than 22 for men, 102 for women), sedentary lifestyle and smoking/ tobacco exposure. Use of agents associated with hypertension: none. History of target organ damage: none. She was recently started on Amlodipine along with lisinopril. Pt thought she was to stop the lisinopril and only take the Amlodipine so this is what she has been doing and reports great control of blood pressure at home.   The following portions of the patient's history were reviewed and updated as appropriate: allergies, current medications, past family history, past medical history, past social history, past surgical history and problem list.  Review of Systems Pertinent items noted in HPI and remainder of comprehensive ROS otherwise negative.     Objective:    BP 131/77   Pulse 84   Temp 98.7 F (37.1 C)   Resp 20   Ht 5\' 7"  (1.702 m)   Wt 82 lb (37.2 kg)   SpO2 100%   BMI 12.84 kg/m   General Appearance:    Alert, cooperative, no distress, appears stated age  Head:    Normocephalic, without obvious abnormality, atraumatic  Eyes:    PERRL, conjunctiva/corneas clear, EOM's intact, fundi    benign, both eyes  Ears:    Normal TM's and external ear canals, both ears  Nose:   Nares normal, septum midline, mucosa normal, no drainage    or sinus tenderness  Throat:   Lips, mucosa, and tongue normal; teeth and gums normal  Neck:   Supple, symmetrical, trachea midline, no adenopathy;    thyroid:  no  enlargement/tenderness/nodules; no carotid   bruit or JVD  Back:     Symmetric, no curvature, ROM normal, no CVA tenderness  Lungs:     Clear to auscultation bilaterally, respirations unlabored  Chest Wall:    No tenderness or deformity   Heart:    Regular rate and rhythm, S1 and S2 normal, no murmur, rub   or gallop  Breast Exam:    No tenderness, masses, or nipple abnormality  Abdomen:     Soft, non-tender, bowel sounds active all four quadrants,    no masses, no organomegaly  Genitalia:    Normal female without lesion, discharge or tenderness  Rectal:    Normal tone, normal prostate, no masses or tenderness;   guaiac negative stool  Extremities:   Extremities normal, atraumatic, no cyanosis or edema  Pulses:   2+ and symmetric all extremities  Skin:   Skin color, texture, turgor normal, no rashes or lesions  Lymph nodes:   Cervical, supraclavicular, and axillary nodes normal  Neurologic:   CNII-XII intact, normal strength, sensation and reflexes    throughout      Assessment and Plan:   Elizabeth Dickerson was seen today for medical management of chronic issues and hypertension.  Diagnoses and all orders for this visit:  Essential hypertension BP well controlled. Changes were made in regimen today. Pt has not been taking lisinopril as prescribed and blood pressure is well controlled, so we will not restart lisinopril. Goal BP is 130/80. Pt aware to report any persistent high or low readings. DASH diet and  exercise encouraged. Exercise at least 150 minutes per week and increase as tolerated. Goal BMI > 25. Stress management encouraged. Avoid nicotine and tobacco product use. Avoid excessive alcohol and NSAID's. Avoid more than 2000 mg of sodium daily. Medications as prescribed. Follow up as scheduled.  -     Basic metabolic panel   Return in 3 months (on 02/25/2020), or if symptoms worsen or fail to improve.  The above assessment and management plan was discussed with the patient. The patient  verbalized understanding of and has agreed to the management plan. Patient is aware to call the clinic if they develop any new symptoms or if symptoms fail to improve or worsen. Patient is aware when to return to the clinic for a follow-up visit. Patient educated on when it is appropriate to go to the emergency department.   Monia Pouch, FNP-C [facility] [address] [place], [place] [address] [phone]

## 2019-11-26 LAB — BASIC METABOLIC PANEL
BUN/Creatinine Ratio: 14 (ref 12–28)
BUN: 14 mg/dL (ref 8–27)
CO2: 22 mmol/L (ref 20–29)
Calcium: 9 mg/dL (ref 8.7–10.3)
Chloride: 102 mmol/L (ref 96–106)
Creatinine, Ser: 0.98 mg/dL (ref 0.57–1.00)
GFR calc Af Amer: 70 mL/min/{1.73_m2} (ref >59)
GFR calc non Af Amer: 60 mL/min/{1.73_m2} (ref >59)
Glucose: 107 mg/dL — ABNORMAL HIGH (ref 65–99)
Potassium: 4 mmol/L (ref 3.5–5.2)
Sodium: 142 mmol/L (ref 134–144)

## 2019-11-28 ENCOUNTER — Encounter: Payer: Self-pay | Admitting: *Deleted

## 2019-11-28 ENCOUNTER — Ambulatory Visit: Payer: Medicare Other | Admitting: Nurse Practitioner

## 2019-11-28 ENCOUNTER — Telehealth: Payer: Self-pay

## 2019-11-28 ENCOUNTER — Other Ambulatory Visit: Payer: Self-pay

## 2019-11-28 ENCOUNTER — Encounter: Payer: Self-pay | Admitting: Nurse Practitioner

## 2019-11-28 VITALS — BP 115/80 | HR 87 | Temp 97.5°F | Ht 67.0 in | Wt 81.2 lb

## 2019-11-28 DIAGNOSIS — R634 Abnormal weight loss: Secondary | ICD-10-CM | POA: Diagnosis not present

## 2019-11-28 DIAGNOSIS — K5900 Constipation, unspecified: Secondary | ICD-10-CM | POA: Diagnosis not present

## 2019-11-28 DIAGNOSIS — R1031 Right lower quadrant pain: Secondary | ICD-10-CM

## 2019-11-28 DIAGNOSIS — K219 Gastro-esophageal reflux disease without esophagitis: Secondary | ICD-10-CM

## 2019-11-28 DIAGNOSIS — R109 Unspecified abdominal pain: Secondary | ICD-10-CM | POA: Insufficient documentation

## 2019-11-28 NOTE — Patient Instructions (Signed)
Your health issues we discussed today were:   GERD (reflux/heartburn): 1. Continue taking Prilosec (omeprazole) with your primary care provider prescribed 3 2. If you have a day where you have "breakthrough reflux" despite Prilosec, you can use Tums or Rolaids 3. Let us know if you have any worsening or severe symptoms  Constipation: 1. I am giving you samples of Linzess 72 mcg.  Take this once a day, first thing in the morning, on an empty stomach 2. As we discussed, some people get diarrhea when they take Linzess.  It typically resolves on its own after a few days 3. If you develop diarrhea and it is unbearable or lasts longer than 5 to 7 days, stop the medicine and call our office 4. Regardless, call us in 2 weeks and let us know if Linzess is helping you have better bowel movements  Abdominal pain: 1. As we discussed, your abdominal pain is likely related to your constipation 2. Hopefully if we can get your bowel movements moving better your pain will improve significantly 3. Call us if you have any worsening or severe abdominal pain  Significant weight loss: 1. As we discussed, think of it as needing to "eat to live". 2. Try to eat at least something 3 meals a day 3. In between your meals have a bottle/can of Ensure.  You should have 2-3 Ensure drinks a day 4. We will schedule a CT scan of your stomach and lower abdomen to evaluate for anything causing weight loss 5. We will schedule you for a colonoscopy and upper endoscopy to also evaluate for causes of weight loss as well as evaluate your stomach because of your significant GERD symptoms 6. Call us if you have any worsening or severe symptoms  Overall I recommend:  1. Continue your other current medications 2. Return for follow-up in 3 months 3. Call us if you have any questions or concerns    At [facility] we value your feedback. You may receive a survey about your visit today. Please share your experience  as we strive to create trusting relationships with our patients to provide genuine, compassionate, quality care.  We appreciate your understanding and patience as we review any laboratory studies, imaging, and other diagnostic tests that are ordered as we care for you. Our office policy is 5 business days for review of these results, and any emergent or urgent results are addressed in a timely manner for your best interest. If you do not hear from our office in 1 week, please contact us.   We also encourage the use of MyChart, which contains your medical information for your review as well. If you are not enrolled in this feature, an access code is on this after visit summary for your convenience. Thank you for allowing [place] to be involved in your care.  It was great to see you today!  I hope you have a great day!!    ---------------------------------------------------------------  COVID-19 Vaccine Information can be found at: [email or web address] For questions related to vaccine distribution or appointments, please email [email or web address] or call [phone].   ---------------------------------------------------------------

## 2019-11-28 NOTE — Progress Notes (Addendum)
REVIEWED-NO ADDITIONAL RECOMMENDATIONS.  Primary Care Physician:  Baruch Gouty, FNP Primary Gastroenterologist:  Dr. Oneida Alar  Chief Complaint  Patient presents with  . Weight Loss    going on x 2012 used to weigh 125lbs  . Gastroesophageal Reflux    feels a lot of phlem in throat and then will have trouble swallowing until it comes out  . Anorexia    HPI:   Elizabeth Dickerson is a 69 y.o. female who presents on referral from primary care for reflux and weight loss.  Reviewed information associated with referral including primary care office visit dated 10/28/2019 for multiple issues.  At that time the patient reported ongoing weight loss since 2016 with associated fatigue.  Does not have a great appetite nor eat much daily.  Only 1-2 bowel movements a week which has been her normal for several years.  No red flag/warning signs or symptoms associated.  Denies alcohol use.  Also noted GERD on omeprazole without side effects, currently taking her medication as prescribed.  Does have somewhat of a persistent cough but no dysphagia or dyspepsia.  No water brash.  Overall it sounds like her GERD symptoms are well managed.  Recommended chest x-ray, labs, referral to GI for possible colonoscopy and EGD due to ongoing weight loss.  Continue omeprazole 20 mg daily.  DEXA scan scheduled.  Due for colon cancer screening.  Labs completed at her 10/28/2019 primary care office visit found essentially normal CMP, normal CBC with no leukocytosis or anemia, platelet count normal.  Hepatitis C antibody screening negative.  No history of colonoscopy or endoscopy found in our system.  Today she states she's doing ok overall. She has been losing weight unintentionally since 2012. Over the past year she has lost 4 lbs in the past year (from 85 lbs to 81 lbs today). She had an EGD in the 1980's, has never had a colonoscopy. Has been having GERD and constipation. GERD symptoms have been going on for years, never taken  anything. She is taking omeprazole daily which has helped her GERD symptoms quite a bit; only has breakthrough once a week. Identified triggers is "red sauce" like spaghetti sauce. Doesn't take anything for breakthrough. No dysphagia or odynophagia.   Has been constipated for some time as well. Has a bowel movement every 2-3 days for the past month (previously had a bowel movement daily); she feels Fosamax is worsening her constipation. When she does have a bowel movement stools are hard, straining, incomplete emptying. Has started taking MiraLAX for constipation but not generally effective. Drinks "not enough" water ("maybe a glass a day") but does eat a lot of fiber daily with fruits and veggies. Has intermittent RLQ abdominal pain which improves after a bowel movement. Pain is crampy when it occurs. Has persistent nausea (especially when constipated). Has a lot of phlegm as well.   Denies hematochezia, melena, fever, chills. Denies URI or flu-like symptoms. Denies loss of sense of taste or smell. Has not been tested for COVID-19. Is refusing a COVID-19 shot. Denies chest pain, dyspnea, dizziness, lightheadedness, syncope, near syncope. Denies any other upper or lower GI symptoms.  Overall states she has a poor appetite/no appetite. Will often cook breakfast and then not want it.  Past Medical History:  Diagnosis Date  . Anxiety    panic attack   . Hypertension     Past Surgical History:  Procedure Laterality Date  . TUBAL LIGATION      Current Outpatient Medications  Medication Sig Dispense Refill  . alendronate (FOSAMAX) 70 MG tablet Take 1 tablet (70 mg total) by mouth every 7 (seven) days. Take with a full glass of water on an empty stomach. 4 tablet 11  . amLODipine (NORVASC) 5 MG tablet Take 1 tablet (5 mg total) by mouth daily. 90 tablet 3  . calcium-vitamin D (OSCAL 500/200 D-3) 500-200 MG-UNIT tablet Take 2 tablets by mouth daily with breakfast. 180 tablet 4  . escitalopram  (LEXAPRO) 5 MG tablet Take 1 tablet (5 mg total) by mouth daily. 30 tablet 3   No current facility-administered medications for this visit.    Allergies as of 11/28/2019  . (No Known Allergies)    Family History  Problem Relation Age of Onset  . Hearing loss Brother   . Cancer Sister        breast  . Colon cancer Neg Hx   . Gastric cancer Neg Hx   . Esophageal cancer Neg Hx     Social History   Socioeconomic History  . Marital status: Married    Spouse name: Not on file  . Number of children: Not on file  . Years of education: Not on file  . Highest education level: Not on file  Occupational History  . Not on file  Tobacco Use  . Smoking status: Current Every Day Smoker    Packs/day: 0.10    Years: 30.00    Pack years: 3.00    Types: Cigarettes  . Smokeless tobacco: Never Used  Substance and Sexual Activity  . Alcohol use: Not Currently  . Drug use: Yes    Frequency: 0.5 times per week    Types: Marijuana    Comment: Marijuana about twice a month (as of 11/28/19)  . Sexual activity: Not on file  Other Topics Concern  . Not on file  Social History Narrative  . Not on file   Social Determinants of Health   Financial Resource Strain:   . Difficulty of Paying Living Expenses: Not on file  Food Insecurity:   . Worried About Charity fundraiser in the Last Year: Not on file  . Ran Out of Food in the Last Year: Not on file  Transportation Needs:   . Lack of Transportation (Medical): Not on file  . Lack of Transportation (Non-Medical): Not on file  Physical Activity:   . Days of Exercise per Week: Not on file  . Minutes of Exercise per Session: Not on file  Stress:   . Feeling of Stress : Not on file  Social Connections:   . Frequency of Communication with Friends and Family: Not on file  . Frequency of Social Gatherings with Friends and Family: Not on file  . Attends Religious Services: Not on file  . Active Member of Clubs or Organizations: Not on file  .  Attends Archivist Meetings: Not on file  . Marital Status: Not on file  Intimate Partner Violence:   . Fear of Current or Ex-Partner: Not on file  . Emotionally Abused: Not on file  . Physically Abused: Not on file  . Sexually Abused: Not on file    Review of Systems: General: Negative for anorexia, fever, chills, fatigue, weakness. Poor/No appetite ENT: Negative for hoarseness, difficulty swallowing. CV: Negative for chest pain, angina, palpitations, peripheral edema.  Respiratory: Negative for dyspnea at rest, cough, sputum, wheezing.  GI: See history of present illness. MS: Negative for joint pain, low back pain.  Derm: Negative for  rash or itching.   Endo: Negative for unusual weight change.  Heme: Negative for bruising or bleeding. Allergy: Negative for rash or hives.    Physical Exam: BP 115/80   Pulse 87   Temp (!) 97.5 F (36.4 C) (Oral)   Ht 5\' 7"  (1.702 m)   Wt 81 lb 3.2 oz (36.8 kg)   BMI 12.72 kg/m  General:   Alert and oriented. Pleasant and cooperative. Significantly under-nourished thin/anorexic appearing.  Head:  Normocephalic and atraumatic. Eyes:  Without icterus, sclera clear and conjunctiva pink.  Ears:  Normal auditory acuity. Cardiovascular:  S1, S2 present without murmurs appreciated. Extremities without clubbing or edema. Respiratory:  Clear to auscultation bilaterally. No wheezes, rales, or rhonchi. No distress.  Gastrointestinal:  +BS, soft, non-tender and non-distended. No HSM noted. No guarding or rebound. No masses appreciated.  Rectal:  Deferred  Musculoskalatal:  Symmetrical without gross deformities. Skin:  Intact without significant lesions or rashes. Neurologic:  Alert and oriented x4;  grossly normal neurologically. Psych:  Alert and cooperative. Normal mood and affect. Heme/Lymph/Immune: No excessive bruising noted.    11/28/2019 3:47 PM   Disclaimer: This note was dictated with voice recognition software. Similar  sounding words can inadvertently be transcribed and may not be corrected upon review.

## 2019-11-28 NOTE — Telephone Encounter (Signed)
No PA needed for CT abd/pelvis w/contrast per [facility] website.  PA for EGD (no PA needed for TCS) submitted via [facility] website. Case approved. PA# [id number], valid 12/13/19-03/12/20.

## 2019-11-29 ENCOUNTER — Encounter: Payer: Self-pay | Admitting: Gastroenterology

## 2019-11-29 NOTE — Assessment & Plan Note (Signed)
Drastic and significant weight loss which the patient feels has been ongoing since about 2012.  She has lost 4 pounds in the last year, however she does not have much more to lose.  Her BMI today is 12.7.  She is quite anorexic.  She states that she just does not have an appetite.  She has had GERD symptoms quite a bit but her symptoms are pretty well managed on a PPI with only breakthrough once a week with known triggers such as "red sauce" but does not take anything for breakthrough.  She had an EGD in the 1980s but has never had a colonoscopy.  Given her significant weight loss that is progressive I am somewhat concerned.  I doubt GERD is playing a significant role in her appetite.  We discussed the mantra of "eat to live" and I recommended she have some amount of food for 3 meals a day, and drink an Ensure between meals twice a day.  She feels she can do this.  I will also check a CT of the abdomen and pelvis for any obvious abnormalities contributing to her weight loss.  We will plan to proceed with a colonoscopy and endoscopy due to significant underweight and no history of colonoscopy or recent EGD.  Follow-up in 3 months.  Proceed with colonoscopy and EGD with Dr. Oneida Alar in the near future. The risks, benefits, and alternatives have been discussed in detail with the patient. They state understanding and desire to proceed.   The patient is on Lexapro 5 mg daily.  No other anticoagulants, anxiolytics, chronic pain medications, antidepressants, antidiabetics, or iron supplements.  Conscious sedation would be adequate for her procedure.

## 2019-11-29 NOTE — Assessment & Plan Note (Addendum)
GERD symptoms generally well controlled on PPI.  Breakthrough about once a week for which she does not take anything.  Known triggers include "red sauce" like spaghetti sauce, acidic, spicy foods.  Recommend she continue omeprazole for now.  She can use Tums or Rolaids over-the-counter for breakthrough symptoms.  Return for follow-up in 3 months.

## 2019-11-29 NOTE — Assessment & Plan Note (Signed)
The patient does have constipation, which she states has been ongoing for years.  Bowel movement every 2 to 3 days for the past month.  She feels Fosamax has contributed to worsening constipation.  Her bowel movements result in hard stools with straining and incomplete emptying.  Has tried MiraLAX but not very effective.  She does not drink enough water, but eats plenty of fiber.  Recommend she increase her water intake for adequate hydration as well as to help with stool softening.  I will also start her on Linzess 72 mcg daily.  Will provide samples for 1 to 2 weeks.  Have discussed the possible side effect of diarrhea which is typically self resolved, but she can notify us of persistent or severe symptoms.  Requested progress report regardless in 2 weeks.  Further medication titration based on her clinical response.  Follow-up in 3 months.

## 2019-11-29 NOTE — Assessment & Plan Note (Signed)
Intermittent right lower quadrant abdominal pain that improves after bowel movement.  Likely related to her constipation.  I feel if we get her constipation under better control her abdominal pain will probably improve.  She may have an element, officially, of IBS constipation type versus CIC or OIC.  Constipation medications as per above.  CT of the abdomen and pelvis for significant weight loss will also help shed light on any acute process.  Follow-up in 3 months.

## 2019-12-11 ENCOUNTER — Other Ambulatory Visit (HOSPITAL_COMMUNITY)
Admission: RE | Admit: 2019-12-11 | Discharge: 2019-12-11 | Disposition: A | Discharge status: Home / Self Care | Payer: Medicare Other | Source: Ambulatory Visit | Attending: Gastroenterology | Admitting: Gastroenterology

## 2019-12-11 ENCOUNTER — Other Ambulatory Visit: Payer: Self-pay

## 2019-12-11 DIAGNOSIS — Z01812 Encounter for preprocedural laboratory examination: Secondary | ICD-10-CM | POA: Diagnosis not present

## 2019-12-11 DIAGNOSIS — Z20822 Contact with and (suspected) exposure to covid-19: Secondary | ICD-10-CM | POA: Insufficient documentation

## 2019-12-11 LAB — SARS CORONAVIRUS 2 (TAT 6-24 HRS): SARS Coronavirus 2: NEGATIVE

## 2019-12-13 ENCOUNTER — Encounter (HOSPITAL_COMMUNITY)
Admission: RE | Disposition: A | Discharge status: Home / Self Care | Payer: Self-pay | Source: Home / Self Care | Attending: Gastroenterology

## 2019-12-13 ENCOUNTER — Other Ambulatory Visit: Payer: Self-pay

## 2019-12-13 ENCOUNTER — Encounter (HOSPITAL_COMMUNITY): Payer: Self-pay | Admitting: Gastroenterology

## 2019-12-13 ENCOUNTER — Ambulatory Visit (HOSPITAL_COMMUNITY)
Admission: RE | Admit: 2019-12-13 | Discharge: 2019-12-13 | Disposition: A | Discharge status: Home / Self Care | Payer: Medicare Other | Attending: Gastroenterology | Admitting: Gastroenterology

## 2019-12-13 DIAGNOSIS — Z803 Family history of malignant neoplasm of breast: Secondary | ICD-10-CM | POA: Diagnosis not present

## 2019-12-13 DIAGNOSIS — K635 Polyp of colon: Secondary | ICD-10-CM | POA: Diagnosis not present

## 2019-12-13 DIAGNOSIS — I10 Essential (primary) hypertension: Secondary | ICD-10-CM | POA: Diagnosis not present

## 2019-12-13 DIAGNOSIS — D122 Benign neoplasm of ascending colon: Secondary | ICD-10-CM

## 2019-12-13 DIAGNOSIS — D124 Benign neoplasm of descending colon: Secondary | ICD-10-CM

## 2019-12-13 DIAGNOSIS — F419 Anxiety disorder, unspecified: Secondary | ICD-10-CM | POA: Diagnosis not present

## 2019-12-13 DIAGNOSIS — R634 Abnormal weight loss: Secondary | ICD-10-CM | POA: Diagnosis not present

## 2019-12-13 DIAGNOSIS — D125 Benign neoplasm of sigmoid colon: Secondary | ICD-10-CM | POA: Diagnosis not present

## 2019-12-13 DIAGNOSIS — Z820 Family history of epilepsy and other diseases of the nervous system: Secondary | ICD-10-CM | POA: Diagnosis not present

## 2019-12-13 DIAGNOSIS — D123 Benign neoplasm of transverse colon: Secondary | ICD-10-CM

## 2019-12-13 DIAGNOSIS — K5900 Constipation, unspecified: Secondary | ICD-10-CM | POA: Diagnosis not present

## 2019-12-13 DIAGNOSIS — K2970 Gastritis, unspecified, without bleeding: Secondary | ICD-10-CM

## 2019-12-13 DIAGNOSIS — B9681 Helicobacter pylori [H. pylori] as the cause of diseases classified elsewhere: Secondary | ICD-10-CM | POA: Diagnosis not present

## 2019-12-13 DIAGNOSIS — F17210 Nicotine dependence, cigarettes, uncomplicated: Secondary | ICD-10-CM | POA: Insufficient documentation

## 2019-12-13 DIAGNOSIS — Q438 Other specified congenital malformations of intestine: Secondary | ICD-10-CM | POA: Diagnosis not present

## 2019-12-13 DIAGNOSIS — D129 Benign neoplasm of anus and anal canal: Secondary | ICD-10-CM | POA: Diagnosis not present

## 2019-12-13 DIAGNOSIS — Z79899 Other long term (current) drug therapy: Secondary | ICD-10-CM | POA: Diagnosis not present

## 2019-12-13 DIAGNOSIS — K5730 Diverticulosis of large intestine without perforation or abscess without bleeding: Secondary | ICD-10-CM | POA: Insufficient documentation

## 2019-12-13 DIAGNOSIS — K219 Gastro-esophageal reflux disease without esophagitis: Secondary | ICD-10-CM | POA: Insufficient documentation

## 2019-12-13 DIAGNOSIS — D120 Benign neoplasm of cecum: Secondary | ICD-10-CM | POA: Diagnosis not present

## 2019-12-13 DIAGNOSIS — K621 Rectal polyp: Secondary | ICD-10-CM

## 2019-12-13 HISTORY — PX: BIOPSY: SHX5522

## 2019-12-13 HISTORY — PX: ESOPHAGOGASTRODUODENOSCOPY: SHX5428

## 2019-12-13 HISTORY — PX: COLONOSCOPY: SHX5424

## 2019-12-13 HISTORY — PX: POLYPECTOMY: SHX5525

## 2019-12-13 SURGERY — COLONOSCOPY
Anesthesia: Moderate Sedation

## 2019-12-13 MED ORDER — STERILE WATER FOR IRRIGATION IR SOLN
Status: DC | PRN
  Administered 2019-12-13: 2.5 mL

## 2019-12-13 MED ORDER — METOPROLOL TARTRATE 5 MG/5ML IV SOLN
INTRAVENOUS | Status: AC
  Filled 2019-12-13: qty 5

## 2019-12-13 MED ORDER — SODIUM CHLORIDE (PF) 0.9 % IJ SOLN
PREFILLED_SYRINGE | INTRAMUSCULAR | Status: DC | PRN
  Administered 2019-12-13: 2.5 mL
  Administered 2019-12-13: 1 mL
  Administered 2019-12-13: 3 mL

## 2019-12-13 MED ORDER — MIDAZOLAM HCL 5 MG/5ML IJ SOLN
INTRAMUSCULAR | Status: AC
  Filled 2019-12-13: qty 10

## 2019-12-13 MED ORDER — SIMETHICONE 40 MG/0.6ML PO SUSP
ORAL | Status: AC
  Filled 2019-12-13: qty 0.6

## 2019-12-13 MED ORDER — HYDRALAZINE HCL 20 MG/ML IJ SOLN
INTRAMUSCULAR | Status: AC
  Administered 2019-12-13: 11:00:00 10 mg
  Filled 2019-12-13: qty 1

## 2019-12-13 MED ORDER — LIDOCAINE VISCOUS HCL 2 % MT SOLN
OROMUCOSAL | Status: DC | PRN
  Administered 2019-12-13: 4 mL via OROMUCOSAL

## 2019-12-13 MED ORDER — SPOT INK MARKER SYRINGE KIT
PACK | SUBMUCOSAL | Status: DC | PRN
  Administered 2019-12-13: 3 mL via SUBMUCOSAL
  Administered 2019-12-13: 1 mL via SUBMUCOSAL

## 2019-12-13 MED ORDER — LIDOCAINE VISCOUS HCL 2 % MT SOLN
OROMUCOSAL | Status: AC
  Filled 2019-12-13: qty 15

## 2019-12-13 MED ORDER — METOPROLOL TARTRATE 5 MG/5ML IV SOLN
INTRAVENOUS | Status: DC | PRN
  Administered 2019-12-13 (×2): 2.5 mg via INTRAVENOUS

## 2019-12-13 MED ORDER — HYDRALAZINE HCL 20 MG/ML IJ SOLN
INTRAMUSCULAR | Status: DC | PRN
  Administered 2019-12-13: 10 mg via INTRAVENOUS

## 2019-12-13 MED ORDER — SPOT INK MARKER SYRINGE KIT
PACK | SUBMUCOSAL | Status: AC
  Filled 2019-12-13: qty 5

## 2019-12-13 MED ORDER — MEPERIDINE HCL 100 MG/ML IJ SOLN
INTRAMUSCULAR | Status: DC | PRN
  Administered 2019-12-13 (×2): 25 mg via INTRAVENOUS
  Administered 2019-12-13: 25 mg
  Administered 2019-12-13: 25 mg via INTRAVENOUS

## 2019-12-13 MED ORDER — MEPERIDINE HCL 100 MG/ML IJ SOLN
INTRAMUSCULAR | Status: AC
  Filled 2019-12-13: qty 2

## 2019-12-13 MED ORDER — MIDAZOLAM HCL 5 MG/5ML IJ SOLN
INTRAMUSCULAR | Status: DC | PRN
  Administered 2019-12-13: 2 mg via INTRAVENOUS
  Administered 2019-12-13 (×6): 1 mg via INTRAVENOUS

## 2019-12-13 MED ORDER — ONDANSETRON 4 MG PO TBDP
ORAL_TABLET | ORAL | Status: AC
  Filled 2019-12-13: qty 1

## 2019-12-13 MED ORDER — ONDANSETRON 4 MG PO TBDP
4.0000 mg | ORAL_TABLET | Freq: Once | ORAL | Status: AC
  Administered 2019-12-13: 4 mg via ORAL

## 2019-12-13 MED ORDER — SODIUM CHLORIDE FLUSH 0.9 % IV SOLN
INTRAVENOUS | Status: AC
  Filled 2019-12-13: qty 20

## 2019-12-13 MED ORDER — ONDANSETRON 4 MG PO TBDP: 4.0000 mg | ORAL_TABLET | Freq: Once | ORAL | Status: AC

## 2019-12-13 MED ORDER — ONDANSETRON 4 MG PO TBDP
ORAL_TABLET | ORAL | Status: AC
  Administered 2019-12-13: 4 mg via ORAL
  Filled 2019-12-13: qty 1

## 2019-12-13 MED ORDER — SODIUM CHLORIDE 0.9 % IV SOLN: INTRAVENOUS | Status: DC

## 2019-12-13 MED ORDER — EPINEPHRINE 1 MG/10ML IJ SOSY
PREFILLED_SYRINGE | INTRAMUSCULAR | Status: AC
  Filled 2019-12-13: qty 10

## 2019-12-13 MED ORDER — HYDRALAZINE HCL 20 MG/ML IJ SOLN: 10.0000 mg | Freq: Once | INTRAMUSCULAR | Status: DC

## 2019-12-13 NOTE — Op Note (Signed)
[facility] Patient Name: Elizabeth Dickerson Procedure Date: 12/13/2019 7:10 AM MRN: [id number] Date of Birth: 1951/01/15 Attending MD: Barney Drain MD, MD CSN: [id number] Age: 69 Admit Type: Outpatient Procedure:                Colonoscopy-EMR/COLD FORCEPS/SNARE                            POLYPECTOMY/SUBMUCOSAL INJECTION/CLIPS x4 Indications:              Weight loss Providers:                Barney Drain MD, MD, Lurline Del, RN, Aram Candela Referring MD:             Connye Burkitt. Rakes Medicines:                Meperidine 100 mg IV, Midazolam 6 mg IV Complications:            No immediate complications. Estimated Blood Loss:     Estimated blood loss was minimal. Procedure:                Pre-Anesthesia Assessment:                           - Prior to the procedure, a History and Physical                            was performed, and patient medications and                            allergies were reviewed. The patient's tolerance of                            previous anesthesia was also reviewed. The risks                            and benefits of the procedure and the sedation                            options and risks were discussed with the patient.                            All questions were answered, and informed consent                            was obtained. Prior Anticoagulants: The patient has                            taken no previous anticoagulant or antiplatelet                            agents. ASA Grade Assessment: II - A patient with                            mild systemic disease. After reviewing the risks  and benefits, the patient was deemed in                            satisfactory condition to undergo the procedure.                            After obtaining informed consent, the colonoscope                            was passed under direct vision. Throughout the                            procedure, the patient's blood pressure,  pulse, and                            oxygen saturations were monitored continuously. The                            [id number] scope was introduced through                            the anus and advanced to the 5 cm into the ileum.                            The colonoscopy was performed with difficulty due                            to restricted mobility of the colon, significant                            looping and a tortuous colon. Successful completion                            of the procedure was aided by increasing the dose                            of sedation medication, changing the patient to a                            supine position, changing endoscopes, straightening                            and shortening the scope to obtain bowel loop                            reduction and COLOWRAP. The patient tolerated the                            procedure fairly well. The quality of the bowel                            preparation was good. The terminal ileum, ileocecal  valve, appendiceal orifice, and rectum were                            photographed. Scope In: 8:09:02 AM Scope Out: 9:49:48 AM Scope Withdrawal Time: 1 hour 36 minutes 39 seconds  Total Procedure Duration: 1 hour 40 minutes 46 seconds  Findings:      The terminal ileum appeared normal.      A 30 mm polyp was found in the mid sigmoid colon. The polyp was sessile.       Area was successfully injected with 4 mL of a 1:10,000 solution of       epinephrine for drug delivery. Area was successfully injected with 3 mL       [place] for a lift polypectomy. Area was tattooed with an injection of 2       mL of Spot (carbon black). Preparations were made for mucosal resection.       Snare mucosal resection was performed. Resection was complete, and       retrieval was complete. To prevent bleeding after the polypectomy, three       hemostatic clips were successfully placed (MR  conditional). There was no       bleeding at the end of the procedure.      A 3 mm polyp was found in the mid sigmoid colon. The polyp was sessile.       The polyp was removed with a cold biopsy forceps. Resection and       retrieval were complete.      NINE sessile polyps were found in the sigmoid colon, ascending colon and       cecum. The polyps were 3 to 6 mm in size. These polyps were removed with       a cold snare. Resection and retrieval were complete.      Nine sessile polyps were found in the rectum, sigmoid colon, splenic       flexure, proximal transverse colon and ascending colon. The polyps were       4 to 6 mm in size. These polyps were removed with a hot snare. Resection       and retrieval were complete.      A few small-mouthed diverticula were found in the transverse colon and       ascending colon.      The recto-sigmoid colon, sigmoid colon, descending colon and splenic       flexure were grossly tortuous. Impression:               - The examined portion of the ileum was normal.                           - One 30 mm polyp in the mid sigmoid colon, removed                            with mucosal resection. Resected and retrieved.                            Injected. Tattooed. Clips (MR conditional) x3 were                            placed.(~30 CM FROM ANAL VERGE, BTL 4)                           -  One 3 mm polyp in the mid sigmoid colon, removed                            with a cold biopsy forceps. Resected and retrieved.                           - EIGHT 3 to 6 mm polyps in the sigmoid colon(1),                            in the ascending colon(4) and in the cecum(3),                            removed with a cold snare. Resected and retrieved.                           - TEN 4 to 6 mm polyps in the rectum(2), in the                            sigmoid colon(1), at the splenic flexure(2),                            DESCENDING COLON(2), PROXIMAL TRANSVERSE COLON(1)                             and in the ascending colon(2), removed with a hot                            snare. Resected and retrieved.                           - Diverticulosis in the transverse colon and in the                            ascending colon.                           - Tortuous colon.                           - . Moderate Sedation:      Moderate (conscious) sedation was administered by the endoscopy nurse       and supervised by the endoscopist. The following parameters were       monitored: oxygen saturation, heart rate, blood pressure, and response       to care. Total physician intraservice time was 156 mins. Recommendation:           - Patient has a contact number available for                            emergencies. The signs and symptoms of potential                            delayed complications were discussed with the  patient. Return to normal activities tomorrow.                            Written discharge instructions were provided to the                            patient.                           - High fiber diet.                           - Continue present medications.                           - Await pathology results.                           - Repeat colonoscopy date to be determined after                            pending pathology results are reviewed for                            surveillance.                           - Return to GI clinic in 4 months. Procedure Code(s):        --- Professional ---                           [phone], Colonoscopy, flexible; with endoscopic                            mucosal resection                           45385, 51, Colonoscopy, flexible; with removal of                            tumor(s), polyp(s), or other lesion(s) by snare                            technique                           45380, 81, Colonoscopy, flexible; with biopsy,                            single or  multiple Diagnosis Code(s):        --- Professional ---                           K63.5, Polyp of colon                           K62.1, Rectal polyp  R63.4, Abnormal weight loss                           K57.30, Diverticulosis of large intestine without                            perforation or abscess without bleeding                           Q43.8, Other specified congenital malformations of                            intestine CPT copyright 2019 [employer]. All rights reserved. The codes documented in this report are preliminary and upon coder review may  be revised to meet current compliance requirements. Barney Drain, MD Barney Drain MD, MD 12/13/2019 10:38:47 AM This report has been signed electronically. Number of Addenda: 0

## 2019-12-13 NOTE — H&P (Signed)
Primary Care Physician:  Baruch Gouty, FNP Primary Gastroenterologist:  Dr. Oneida Alar  Pre-Procedure History & Physical: HPI:  Elizabeth Dickerson is a 69 y.o. female here for  Anorexia/weight loss.  Past Medical History:  Diagnosis Date  . Anxiety    panic attack   . Hypertension     Past Surgical History:  Procedure Laterality Date  . TUBAL LIGATION      Prior to Admission medications   Medication Sig Start Date End Date Taking? Authorizing Provider  alendronate (FOSAMAX) 70 MG tablet Take 1 tablet (70 mg total) by mouth every 7 (seven) days. Take with a full glass of water on an empty stomach. 10/29/19  Yes Rakes, Connye Burkitt, FNP  amLODipine (NORVASC) 5 MG tablet Take 1 tablet (5 mg total) by mouth daily. 10/28/19  Yes Rakes, Connye Burkitt, FNP  calcium-vitamin D (OSCAL 500/200 D-3) 500-200 MG-UNIT tablet Take 2 tablets by mouth daily with breakfast. 10/29/19 01/27/20 Yes Rakes, Connye Burkitt, FNP  escitalopram (LEXAPRO) 5 MG tablet Take 1 tablet (5 mg total) by mouth daily. 04/16/19 12/11/19 Yes Rakes, Connye Burkitt, FNP  hydrOXYzine (ATARAX/VISTARIL) 10 MG tablet Take 10 mg by mouth 3 (three) times daily as needed for anxiety.   Yes [provider]    Allergies as of 11/28/2019  . (No Known Allergies)    Family History  Problem Relation Age of Onset  . Hearing loss Brother   . Cancer Sister        breast  . Colon cancer Neg Hx   . Gastric cancer Neg Hx   . Esophageal cancer Neg Hx     Social History   Socioeconomic History  . Marital status: Married    Spouse name: Not on file  . Number of children: Not on file  . Years of education: Not on file  . Highest education level: Not on file  Occupational History  . Not on file  Tobacco Use  . Smoking status: Current Every Day Smoker    Packs/day: 0.10    Years: 30.00    Pack years: 3.00    Types: Cigarettes  . Smokeless tobacco: Never Used  Substance and Sexual Activity  . Alcohol use: Not Currently  . Drug use: Yes    Frequency:  0.5 times per week    Types: Marijuana    Comment: Marijuana about twice a month (as of 11/28/19)  . Sexual activity: Not on file  Other Topics Concern  . Not on file  Social History Narrative  . Not on file   Social Determinants of Health   Financial Resource Strain:   . Difficulty of Paying Living Expenses:   Food Insecurity:   . Worried About Charity fundraiser in the Last Year:   . Arboriculturist in the Last Year:   Transportation Needs:   . Film/video editor (Medical):   Marland Kitchen Lack of Transportation (Non-Medical):   Physical Activity:   . Days of Exercise per Week:   . Minutes of Exercise per Session:   Stress:   . Feeling of Stress :   Social Connections:   . Frequency of Communication with Friends and Family:   . Frequency of Social Gatherings with Friends and Family:   . Attends Religious Services:   . Active Member of Clubs or Organizations:   . Attends Archivist Meetings:   Marland Kitchen Marital Status:   Intimate Partner Violence:   . Fear of Current or Ex-Partner:   .  Emotionally Abused:   Marland Kitchen Physically Abused:   . Sexually Abused:     Review of Systems: See HPI, otherwise negative ROS   Physical Exam: BP (!) 148/68   Pulse 79   Temp 97.8 F (36.6 C) (Oral)   Resp 19   SpO2 100%  General:   Alert,  pleasant and cooperative in NAD Head:  Normocephalic and atraumatic. Neck:  Supple; Lungs:  Clear throughout to auscultation.    Heart:  Regular rate and rhythm. Abdomen:  Soft, nontender and nondistended. Normal bowel sounds, without guarding, and without rebound.   Neurologic:  Alert and  oriented x4;  grossly normal neurologically.  Impression/Plan:     Anorexia/weight loss  PLAN: 1. TCS/?EGD TODAY. DISCUSSED PROCEDURE, BENEFITS, & RISKS: < 1% chance of medication reaction, bleeding, perforation, ASPIRATION, or rupture of spleen/liver requiring surgery to fix it and missed polyps < 1 cm 10-20% of the time.

## 2019-12-13 NOTE — OR Nursing (Signed)
Patient assisted to bathroom. Nausea symptoms are lessen , patient now resting in recliner with eyes closed waiting for daughter to come back to get her daughter has an errand to run lasting approximately  an hour.

## 2019-12-13 NOTE — Discharge Instructions (Signed)
NO SOURCE FOR YOUR WEIGHT LOSS WAS IDENTIFIED. You had 20 polyps removed. TWO WERE WAS LARGER AND I TATTOOED THE LEFT COLON NAR THE POLYP SITES. I PLACED FOUR CLIPS TO PREVENT BLEEDING IN 7-10 DAYS. You have DIVERTICULOSIS IN YOUR RIGHT & LEFT COLON. You have gastritis.  I biopsied your stomach.    IF YOU NEED AN MRI, LET THE RADIOLOGY TECH KNOW THAT YOU HAD FOUR CLIPS PLACED IN YOUR LEFT COLON. THEY SHOULD FALL OFF IN 30 DAYS.  DRINK WATER TO KEEP YOUR URINE LIGHT YELLOW.  FOLLOW A HIGH FIBER/LOW FAT DIET. AVOID ITEMS THAT CAUSE BLOATING. SEE INFO BELOW.   YOUR BIOPSY RESULTS WILL BE BACK IN 5 BUSINESS DAYS.  Next colonoscopy in 1-3 years.   FOLLOW UP IN 4 MOS.   ENDOSCOPY Care After Read the instructions outlined below and refer to this sheet in the next week. These discharge instructions provide you with general information on caring for yourself after you leave the hospital. While your treatment has been planned according to the most current medical practices available, unavoidable complications occasionally occur. If you have any problems or questions after discharge, call DR. Cinnamon Morency, [phone].  ACTIVITY  You may resume your regular activity, but move at a slower pace for the next 24 hours.   Take frequent rest periods for the next 24 hours.   Walking will help get rid of the air and reduce the bloated feeling in your belly (abdomen).   No driving for 24 hours (because of the medicine (anesthesia) used during the test).   You may shower.   Do not sign any important legal documents or operate any machinery for 24 hours (because of the anesthesia used during the test).    NUTRITION  Drink plenty of fluids.   You may resume your normal diet as instructed by your doctor.   Begin with a light meal and progress to your normal diet. Heavy or fried foods are harder to digest and may make you feel sick to your stomach (nauseated).   Avoid alcoholic beverages for 24 hours or  as instructed.    MEDICATIONS  You may resume your normal medications.   WHAT YOU CAN EXPECT TODAY  Some feelings of bloating in the abdomen.   Passage of more gas than usual.   Spotting of blood in your stool or on the toilet paper  .  IF YOU HAD POLYPS REMOVED DURING THE ENDOSCOPY:  Eat a soft diet IF YOU HAVE NAUSEA, BLOATING, ABDOMINAL PAIN, OR VOMITING.    FINDING OUT THE RESULTS OF YOUR TEST Not all test results are available during your visit. DR. Oneida Alar WILL CALL YOU WITHIN 7 DAYS OF YOUR PROCEDUE WITH YOUR RESULTS. Do not assume everything is normal if you have not heard from DR. Recia Sons IN ONE WEEK, CALL HER OFFICE AT [phone].  SEEK IMMEDIATE MEDICAL ATTENTION AND CALL THE OFFICE: [phone] IF:  You have more than a spotting of blood in your stool.   Your belly is swollen (abdominal distention).   You are nauseated or vomiting.   You have a temperature over 101F.   You have abdominal pain or discomfort that is severe or gets worse throughout the day.  High-Fiber Diet A high-fiber diet changes your normal diet to include more whole grains, legumes, fruits, and vegetables. Changes in the diet involve replacing refined carbohydrates with unrefined foods. The calorie level of the diet is essentially unchanged. The Dietary Reference Intake (recommended amount) for adult males is 11  grams per day. For adult females, it is 25 grams per day. Pregnant and lactating women should consume 28 grams of fiber per day. Fiber is the intact part of a plant that is not broken down during digestion. Functional fiber is fiber that has been isolated from the plant to provide a beneficial effect in the body.  PURPOSE  Increase stool bulk.   Ease and regulate bowel movements.   Lower cholesterol.   REDUCE RISK OF COLON CANCER  INDICATIONS THAT YOU NEED MORE FIBER  Constipation and hemorrhoids.   Uncomplicated diverticulosis (intestine condition) and irritable bowel  syndrome.   Weight management.   As a protective measure against hardening of the arteries (atherosclerosis), diabetes, and cancer.   GUIDELINES FOR INCREASING FIBER IN THE DIET  Start adding fiber to the diet slowly. A gradual increase of about 5 more grams (2 servings of most fruits or vegetables) per day is best. Too rapid an increase in fiber may result in constipation, flatulence, and bloating.   Drink enough water and fluids to keep your urine clear or pale yellow. Water, juice, or caffeine-free drinks are recommended. Not drinking enough fluid may cause constipation.   Eat a variety of high-fiber foods rather than one type of fiber.   Try to increase your intake of fiber through using high-fiber foods rather than fiber pills or supplements that contain small amounts of fiber.   The goal is to change the types of food eaten. Do not supplement your present diet with high-fiber foods, but replace foods in your present diet.     Gastritis  Gastritis is inflammation (the body's way of reacting to injury and/or infection) of the stomach. It is often caused by viral or bacterial (germ) infections. It can also be caused BY ASPIRIN, BC/GOODY POWDER'S, (IBUPROFEN) MOTRIN, OR ALEVE (NAPROXEN), chemicals (including alcohol), SPICY FOODS, and medications. This illness may be associated with generalized malaise (feeling tired, not well), UPPER ABDOMINAL STOMACH cramps, and fever. One common bacterial cause of gastritis is an organism known as H. Pylori. This can be treated with antibiotics.      Diverticulosis Diverticulosis is a common condition that develops when small pouches (diverticula) form in the wall of the colon. The risk of diverticulosis increases with age. It happens more often in people who eat a low-fiber diet. Most individuals with diverticulosis have no symptoms. Those individuals with symptoms usually experience belly (abdominal) pain, constipation, or loose stools  (diarrhea).  HOME CARE INSTRUCTIONS  Increase the amount of fiber in your diet as directed by your caregiver or dietician. This may reduce symptoms of diverticulosis.   Drink at least 6 to 8 glasses of water each day to prevent constipation.   Try not to strain when you have a bowel movement.   Avoiding nuts and seeds to prevent complications is  NOT NECESSARY.  FOODS HAVING HIGH FIBER CONTENT INCLUDE:  Fruits. Apple, peach, pear, tangerine, raisins, prunes.   Vegetables. Brussels sprouts, asparagus, broccoli, cabbage, carrot, cauliflower, romaine lettuce, spinach, summer squash, tomato, winter squash, zucchini.   Starchy Vegetables. Baked beans, kidney beans, lima beans, split peas, lentils, potatoes (with skin).   Grains. Whole wheat bread, brown rice, bran flake cereal, plain oatmeal, white rice, shredded wheat, bran muffins.   Polyps, Colon  A polyp is extra tissue that grows inside your body. Colon polyps grow in the large intestine. The large intestine, also called the colon, is part of your digestive system. It is a long, hollow tube at the  end of your digestive tract where your body makes and stores stool. Most polyps are not dangerous. They are benign. This means they are not cancerous. But over time, some types of polyps can turn into cancer. Polyps that are smaller than a pea are usually not harmful. But larger polyps could someday become or may already be cancerous. To be safe, doctors remove all polyps and test them.   PREVENTION There is not one sure way to prevent polyps. You might be able to lower your risk of getting them if you:  Eat more fruits and vegetables and less fatty food.   Do not smoke.   Avoid alcohol.   Exercise every day.   Lose weight if you are overweight.   Eating more calcium and folate can also lower your risk of getting polyps. Some foods that are rich in calcium are milk, cheese, and broccoli. Some foods that are rich in folate are  chickpeas, kidney beans, and spinach.

## 2019-12-13 NOTE — Op Note (Signed)
[facility] Patient Name: Elizabeth Dickerson Procedure Date: 12/13/2019 7:09 AM MRN: [id number] Date of Birth: 01-06-1951 Attending MD: Barney Drain MD, MD CSN: [id number] Age: 69 Admit Type: Outpatient Procedure:                Upper GI endoscopy WITH COLD FORCEPS BIOPSY Indications:              Weight loss Providers:                Barney Drain MD, MD, Lurline Del, RN, Aram Candela Referring MD:             Connye Burkitt. Rakes Medicines:                TCS + Midazolam 2 mg IV Complications:            No immediate complications. Estimated Blood Loss:     Estimated blood loss was minimal. Procedure:                Pre-Anesthesia Assessment:                           - Prior to the procedure, a History and Physical                            was performed, and patient medications and                            allergies were reviewed. The patient's tolerance of                            previous anesthesia was also reviewed. The risks                            and benefits of the procedure and the sedation                            options and risks were discussed with the patient.                            All questions were answered, and informed consent                            was obtained. Prior Anticoagulants: The patient has                            taken no previous anticoagulant or antiplatelet                            agents. ASA Grade Assessment: II - A patient with                            mild systemic disease. After reviewing the risks                            and benefits, the patient was deemed in  satisfactory condition to undergo the procedure.                            After obtaining informed consent, the endoscope was                            passed under direct vision. Throughout the                            procedure, the patient's blood pressure, pulse, and                            oxygen saturations were monitored  continuously. The                            [id number]) scope was introduced through the                            mouth, and advanced to the second part of duodenum.                            The upper GI endoscopy was accomplished without                            difficulty. The patient tolerated the procedure                            well. Scope In: 9:57:28 AM Scope Out: 10:02:46 AM Total Procedure Duration: 0 hours 5 minutes 18 seconds  Findings:      The examined esophagus was normal.      Localized moderate inflammation characterized by congestion (edema),       erosions and erythema was found on the greater curvature of the stomach       and in the gastric antrum. Biopsies were taken with a cold forceps for       Helicobacter pylori testing.      The examined duodenum was normal. Impression:               - Normal esophagus.                           - Gastritis. Biopsied.                           - NO SOURCE FOR WEIGHT LOSS IDENTIFIED Moderate Sedation:      Moderate (conscious) sedation was administered by the endoscopy nurse       and supervised by the endoscopist. The patient's oxygen saturation,       heart rate, blood pressure and response to care were monitored. Total       physician intraservice time was 160 mins. Recommendation:           - Patient has a contact number available for                            emergencies. The signs and symptoms of potential  delayed complications were discussed with the                            patient. Return to normal activities tomorrow.                            Written discharge instructions were provided to the                            patient.                           - High fiber diet.                           - Continue present medications.                           - Await pathology results.                           - Return to GI clinic in 4 months. Procedure Code(s):         --- Professional ---                           [phone], Esophagogastroduodenoscopy, flexible,                            transoral; with biopsy, single or multiple Diagnosis Code(s):        --- Professional ---                           K29.70, Gastritis, unspecified, without bleeding                           R63.4, Abnormal weight loss CPT copyright 2019 [employer]. All rights reserved. The codes documented in this report are preliminary and upon coder review may  be revised to meet current compliance requirements. Barney Drain, MD Barney Drain MD, MD 12/13/2019 10:54:31 AM This report has been signed electronically. Number of Addenda: 0

## 2019-12-16 ENCOUNTER — Other Ambulatory Visit: Payer: Self-pay

## 2019-12-16 ENCOUNTER — Ambulatory Visit (HOSPITAL_COMMUNITY): Payer: Medicare Other

## 2019-12-16 LAB — SURGICAL PATHOLOGY

## 2019-12-19 ENCOUNTER — Telehealth: Payer: Self-pay | Admitting: Gastroenterology

## 2019-12-19 MED ORDER — OMEPRAZOLE 20 MG PO CPDR: DELAYED_RELEASE_CAPSULE | ORAL | 11 refills | Status: DC

## 2019-12-19 MED ORDER — CLARITHROMYCIN 500 MG PO TABS: ORAL_TABLET | ORAL | 0 refills | Status: DC

## 2019-12-19 MED ORDER — AMOXICILLIN 500 MG PO TABS: ORAL_TABLET | ORAL | 0 refills | Status: DC

## 2019-12-19 NOTE — Telephone Encounter (Addendum)
PLEASE CALL PT. Her stomach Bx showed H. Pylori infection. This can cause UPPER ABDOMINAL PAIN, decreased appetite, and weight loss. She needs AMOXICILLIN 500 mg 2 po BID for 10 days and Biaxin 500 mg po bid for 10 days. She needs Omeprazole 20 mg BID for 10 days then 1 po DAILY for 3 mos. Med side effects include NAUSEA, VOMITING, DIARRHEA, ABDOMINAL pain, and metallic taste.     SHE HAD one advanced polyp removed from the left colon(~30 CM FROM THE ANAL VERGE) and 15 additional SIMPLE ADENOMAS. She had four HYPERPLASTIC POLYPS REMOVED.   COMPLETE THE CT ABD/PELVIS TO EVALUATE HER WEIGHT LOSS.  FOLLOW UP IN 6 MOS.  Next colonoscopy in 1 year because the polyp in the LEFT COLON HAD ADVANCED CHANGES.

## 2019-12-19 NOTE — Telephone Encounter (Signed)
Called pt to give her lab results no response left vm

## 2019-12-19 NOTE — Telephone Encounter (Signed)
Patient scheduled and on recall for colonoscopy

## 2019-12-20 NOTE — Telephone Encounter (Signed)
Called pt to give her lab result no response lt vm

## 2019-12-23 ENCOUNTER — Encounter: Payer: Self-pay | Admitting: Emergency Medicine

## 2019-12-23 NOTE — Telephone Encounter (Signed)
Made a 3rd appt to contact pt. Left 3 vm's

## 2019-12-23 NOTE — Telephone Encounter (Signed)
Letter sent.

## 2019-12-23 NOTE — Telephone Encounter (Signed)
Also, called pt daughter Thornell Mule [phone] number is not in service

## 2019-12-30 ENCOUNTER — Telehealth: Payer: Self-pay | Admitting: Gastroenterology

## 2019-12-30 NOTE — Telephone Encounter (Signed)
Patient called inquiring about test results [phone]

## 2019-12-30 NOTE — Telephone Encounter (Signed)
Called lmom

## 2020-01-02 NOTE — Telephone Encounter (Signed)
Called left vm ..

## 2020-01-03 ENCOUNTER — Encounter: Payer: Self-pay | Admitting: *Deleted

## 2020-01-07 ENCOUNTER — Telehealth: Payer: Self-pay | Admitting: Gastroenterology

## 2020-01-07 NOTE — Telephone Encounter (Signed)
[phone] patient called inquiring about procedure results from last month

## 2020-01-08 NOTE — Telephone Encounter (Signed)
Called pt lmom 

## 2020-01-08 NOTE — Telephone Encounter (Signed)
CT doesn't need PA.  CT scheduled for 01/27/20 at 5:00pm, arrive at 4:45pm. NPO 4 hours before test. Pickup contrast before day of test.  Called and informed pt's daughter of CT appt. Letter mailed.

## 2020-01-08 NOTE — Telephone Encounter (Signed)
PT verified name and dob notified her  that I our office has made several attempts to contact her but we have been unsuccessful. Pt stated the other number is no longer in service and gave me an updated phone number that is her daughters  ( I updated in epic) notified  pt  that stomach Bx showed H. Pylori infection. This can cause UPPER ABDOMINAL PAIN, decreased appetite, and weight loss. She needs AMOXICILLIN 500 mg 2 po BID for 10 days and Biaxin 500 mg po bid for 10 days. She needs Omeprazole 20 mg BID for 10 days then 1 po DAILY for 3 mos. Med side effects include NAUSEA, VOMITING, DIARRHEA, ABDOMINAL pain, and metallic taste.I also notified  pt that the provider removed one advanced polyp removed from the left colon(~30 CM FROM THE ANAL VERGE) and 15 additional SIMPLE ADENOMAS. She had four HYPERPLASTIC POLYPS REMOVED. They we will schedule a  COMPLETE THE CT ABD/PELVIS TO EVALUATE HER WEIGHT LOSS. Advised pt to please f/u in 6 months. Pt agreed to ct and to have another colonoscopy in 1 year because the polyp in the LEFT COLON HAD ADVANCED CHANGES.  Also, notified pt that rx were sent over to [place] on 3/25

## 2020-01-09 NOTE — Telephone Encounter (Signed)
Patient scheduled for follow up and on recall for one year repeat tcs

## 2020-01-09 NOTE — Telephone Encounter (Signed)
noted 

## 2020-01-27 ENCOUNTER — Ambulatory Visit (HOSPITAL_COMMUNITY): Payer: Medicare Other

## 2020-02-26 ENCOUNTER — Ambulatory Visit: Payer: Medicare Other | Admitting: Family

## 2020-02-26 ENCOUNTER — Ambulatory Visit: Payer: Medicare Other | Admitting: Family Medicine

## 2020-02-27 ENCOUNTER — Encounter: Payer: Self-pay | Admitting: Family Medicine

## 2020-03-25 ENCOUNTER — Encounter: Payer: Self-pay | Admitting: Nurse Practitioner

## 2020-03-25 ENCOUNTER — Ambulatory Visit: Payer: Medicare Other | Admitting: Nurse Practitioner

## 2020-03-25 NOTE — Progress Notes (Deleted)
Referring Provider: Baruch Gouty, FNP Primary Care Physician:  Baruch Gouty, FNP Primary GI:  Dr. Gala Romney (in the absence of Dr. Oneida Alar); pending Dr. Abbey Chatters  No chief complaint on file.   HPI:   Elizabeth Dickerson is a 69 y.o. female who presents for follow-up on abdominal pain and GERD.The patient was last seen in our office 11/28/2019 for GERD, constipation, right lower quadrant abdominal pain, and weight loss.  Previously noted ongoing weight loss since 2016 with associated fatigue and poor appetite.  Labs completed by primary care on 10/28/2019 found essentially normal CMP, CBC, hepatitis C antibody negative.  No history of colonoscopy or endoscopy.  At her last visit she noted she has been losing weight unintentionally since 2012 and over the past year she lost 4 pounds from 85 pounds to 81 pounds that day.  EGD in the late 1980s but never had a colonoscopy.  Noted GERD and constipation symptoms, GERD symptoms ongoing for years and is "never taken anything".  Takes omeprazole daily which helped her symptoms quite a bit with breakthrough about once weekly.  Constipated for some time with a bowel movement every 2 to 3 days over the past month, wherein she previously had a daily bowel movement.  Feels Fosamax is worsening her constipation.  She notes hard stools with straining and incomplete emptying.  MiraLAX not effective, does not drink "enough water" but does eat a lot of fiber.  Intermittent right lower quadrant crampy pain which improves after a bowel movement.  Overall notes poor appetite and will often cook breakfast and then not want it.  No other overt GI complaints.  Recommended continue omeprazole, use Tums or Rolaids for breakthrough symptoms, trial Linzess 72 g daily, progress report with Linzess in 2 weeks.  Colonoscopy and EGD is scheduled.  Also recommended CT of the abdomen and pelvis.  CT of the abdomen and pelvis does not appear to been completed.    Colonoscopy completed  12/13/2019 found a 30 mm polyp in the mid sigmoid colon that was sessile that was injected with epinephrine solution, status post tattooing, snare mucosal resection performed.  3 hemostatic clips were placed, no bleeding noted at the end.  Another 3 mm polyp found in the mid sigmoid that was sessile and removed.  An additional 9 sessile polyps in the sigmoid colon, ascending colon, cecum that were 3 to 6 mm in size.  An additional 9 sessile polyps in the rectum, sigmoid colon, splenic flexure, proximal transverse colon and ascending colon that were 4 to 6 mm in size.  A few small mouth diverticula in the transverse and ascending colon.  A total of 20 polyps were removed.  Surgical pathology found the polyps to be mostly tubular adenoma with the 30 cm polyp being tubular adenoma with foci of high-grade dysplasia and prominent glandular displacement into the polyp stalk.  EGD the same day found normal esophagus, gastritis status post biopsy, no source for weight loss identified.  Surgical pathology found the biopsies to be gastric antral and oxyntic mucosa with H. pylori associated gastritis. Recommended continue current medications, treatment of H. pylori with amoxicillin/Biaxin/omeprazole.  Complete CT of the abdomen and pelvis is ordered.  Follow-up in 6 months.  Repeat colonoscopy in 1 year due to large polyp with advanced changes.  We were unable to contact the patient over the phone and a letter was sent.  CT was scheduled for 01/27/2020.  However, it again appears it has not been completed.  Today she states       Past Medical History:  Diagnosis Date  . Anxiety    panic attack   . Hypertension     Past Surgical History:  Procedure Laterality Date  . BIOPSY  12/13/2019   Procedure: BIOPSY;  Surgeon: Danie Binder, MD;  [facility];  Service: Endoscopy;;  gastric  . COLONOSCOPY N/A 12/13/2019   Procedure: COLONOSCOPY;  Surgeon: Danie Binder, MD;  [facility];   Service: Endoscopy;  Laterality: N/A;  7:30am  . ESOPHAGOGASTRODUODENOSCOPY N/A 12/13/2019   Procedure: ESOPHAGOGASTRODUODENOSCOPY (EGD);  Surgeon: Danie Binder, MD;  [facility];  Service: Endoscopy;  Laterality: N/A;  . POLYPECTOMY  12/13/2019   Procedure: POLYPECTOMY;  Surgeon: Danie Binder, MD;  [facility];  Service: Endoscopy;;  cecal, ascending, descending, sigmoid,splenic flexure  . TUBAL LIGATION      Current Outpatient Medications  Medication Sig Dispense Refill  . alendronate (FOSAMAX) 70 MG tablet Take 1 tablet (70 mg total) by mouth every 7 (seven) days. Take with a full glass of water on an empty stomach. 4 tablet 11  . amLODipine (NORVASC) 5 MG tablet Take 1 tablet (5 mg total) by mouth daily. 90 tablet 3  . amoxicillin (AMOXIL) 500 MG tablet 2 PO BID FOR 10 DAYS 40 tablet 0  . calcium-vitamin D (OSCAL 500/200 D-3) 500-200 MG-UNIT tablet Take 2 tablets by mouth daily with breakfast. 180 tablet 4  . clarithromycin (BIAXIN) 500 MG tablet 1 PO BID FOR 10 DAYS. 20 tablet 0  . escitalopram (LEXAPRO) 5 MG tablet Take 1 tablet (5 mg total) by mouth daily. 30 tablet 3  . hydrOXYzine (ATARAX/VISTARIL) 10 MG tablet Take 10 mg by mouth 3 (three) times daily as needed for anxiety.    Marland Kitchen omeprazole (PRILOSEC) 20 MG capsule 1 PO 30 mins prior to breakfast and supper 60 capsule 11   No current facility-administered medications for this visit.    Allergies as of 03/25/2020  . (No Known Allergies)    Family History  Problem Relation Age of Onset  . Hearing loss Brother   . Cancer Sister        breast  . Colon cancer Neg Hx   . Gastric cancer Neg Hx   . Esophageal cancer Neg Hx     Social History   Socioeconomic History  . Marital status: Married    Spouse name: Not on file  . Number of children: Not on file  . Years of education: Not on file  . Highest education level: Not on file  Occupational History  . Not on file  Tobacco Use  . Smoking  status: Current Every Day Smoker    Packs/day: 0.10    Years: 30.00    Pack years: 3.00    Types: Cigarettes  . Smokeless tobacco: Never Used  Vaping Use  . Vaping Use: Never used  Substance and Sexual Activity  . Alcohol use: Not Currently  . Drug use: Yes    Frequency: 0.5 times per week    Types: Marijuana    Comment: Marijuana about twice a month (as of 11/28/19)  . Sexual activity: Not on file  Other Topics Concern  . Not on file  Social History Narrative  . Not on file   Social Determinants of Health   Financial Resource Strain:   . Difficulty of Paying Living Expenses:   Food Insecurity:   . Worried About [employer] of  Food in the Last Year:   . [place] in the Last Year:   Transportation Needs:   . Film/video editor (Medical):   Marland Kitchen Lack of Transportation (Non-Medical):   Physical Activity:   . Days of Exercise per Week:   . Minutes of Exercise per Session:   Stress:   . Feeling of Stress :   Social Connections:   . Frequency of Communication with Friends and Family:   . Frequency of Social Gatherings with Friends and Family:   . Attends Religious Services:   . Active Member of Clubs or Organizations:   . Attends Archivist Meetings:   Marland Kitchen Marital Status:     Subjective: Review of Systems  Constitutional: Negative for chills, fever, malaise/fatigue and weight loss.  HENT: Negative for congestion and sore throat.   Respiratory: Negative for cough and shortness of breath.   Cardiovascular: Negative for chest pain and palpitations.  Gastrointestinal: Negative for abdominal pain, blood in stool, diarrhea, melena, nausea and vomiting.  Musculoskeletal: Negative for joint pain and myalgias.  Skin: Negative for rash.  Neurological: Negative for dizziness and weakness.  Endo/Heme/Allergies: Does not bruise/bleed easily.  Psychiatric/Behavioral: Negative for depression. The patient is not nervous/anxious.   All other systems reviewed and are  negative.    Objective: There were no vitals taken for this visit. Physical Exam Vitals and nursing note reviewed.  Constitutional:      General: She is not in acute distress.    Appearance: Normal appearance. She is well-developed. She is not ill-appearing, toxic-appearing or diaphoretic.  HENT:     Head: Normocephalic and atraumatic.     Nose: No congestion or rhinorrhea.  Eyes:     General: No scleral icterus. Cardiovascular:     Rate and Rhythm: Normal rate and regular rhythm.     Heart sounds: Normal heart sounds.  Pulmonary:     Effort: Pulmonary effort is normal. No respiratory distress.     Breath sounds: Normal breath sounds.  Abdominal:     General: Bowel sounds are normal.     Palpations: Abdomen is soft. There is no hepatomegaly, splenomegaly or mass.     Tenderness: There is no abdominal tenderness. There is no guarding or rebound.     Hernia: No hernia is present.  Skin:    General: Skin is warm and dry.     Coloration: Skin is not jaundiced.     Findings: No rash.  Neurological:     General: No focal deficit present.     Mental Status: She is alert and oriented to person, place, and time.  Psychiatric:        Attention and Perception: Attention normal.        Mood and Affect: Mood normal.        Speech: Speech normal.        Behavior: Behavior normal.        Thought Content: Thought content normal.        Cognition and Memory: Cognition and memory normal.       03/25/2020 9:20 AM   Disclaimer: This note was dictated with voice recognition software. Similar sounding words can inadvertently be transcribed and may not be corrected upon review.

## 2020-06-25 DIAGNOSIS — R05 Cough: Secondary | ICD-10-CM | POA: Diagnosis not present

## 2020-06-25 DIAGNOSIS — J309 Allergic rhinitis, unspecified: Secondary | ICD-10-CM | POA: Diagnosis not present

## 2020-06-25 DIAGNOSIS — I10 Essential (primary) hypertension: Secondary | ICD-10-CM | POA: Diagnosis not present

## 2020-07-14 ENCOUNTER — Ambulatory Visit: Payer: Medicare Other | Admitting: Nurse Practitioner

## 2020-07-15 ENCOUNTER — Encounter: Payer: Self-pay | Admitting: Family

## 2020-11-16 ENCOUNTER — Encounter: Payer: Self-pay | Admitting: Internal Medicine

## 2020-12-20 DIAGNOSIS — I10 Essential (primary) hypertension: Secondary | ICD-10-CM | POA: Diagnosis not present

## 2020-12-20 DIAGNOSIS — J449 Chronic obstructive pulmonary disease, unspecified: Secondary | ICD-10-CM | POA: Diagnosis not present

## 2020-12-20 DIAGNOSIS — R059 Cough, unspecified: Secondary | ICD-10-CM | POA: Diagnosis not present

## 2020-12-20 DIAGNOSIS — Z720 Tobacco use: Secondary | ICD-10-CM | POA: Diagnosis not present

## 2020-12-20 DIAGNOSIS — F17210 Nicotine dependence, cigarettes, uncomplicated: Secondary | ICD-10-CM | POA: Diagnosis not present

## 2020-12-20 DIAGNOSIS — J441 Chronic obstructive pulmonary disease with (acute) exacerbation: Secondary | ICD-10-CM | POA: Diagnosis not present

## 2021-02-05 ENCOUNTER — Ambulatory Visit (INDEPENDENT_AMBULATORY_CARE_PROVIDER_SITE_OTHER): Payer: Medicare Other | Admitting: Nurse Practitioner

## 2021-02-05 ENCOUNTER — Encounter: Payer: Self-pay | Admitting: Nurse Practitioner

## 2021-02-05 ENCOUNTER — Other Ambulatory Visit: Payer: Self-pay

## 2021-02-05 VITALS — BP 202/94 | HR 113 | Temp 97.9°F | Resp 20 | Ht 67.0 in | Wt 81.0 lb

## 2021-02-05 DIAGNOSIS — H5711 Ocular pain, right eye: Secondary | ICD-10-CM

## 2021-02-05 MED ORDER — TOBRADEX 0.3-0.1 % OP OINT: 1.0000 "application " | TOPICAL_OINTMENT | Freq: Three times a day (TID) | OPHTHALMIC | 0 refills | Status: DC

## 2021-02-05 MED ORDER — ALBUTEROL SULFATE HFA 108 (90 BASE) MCG/ACT IN AERS: 2.0000 | INHALATION_SPRAY | Freq: Four times a day (QID) | RESPIRATORY_TRACT | 0 refills | Status: DC | PRN

## 2021-02-05 NOTE — Patient Instructions (Signed)

## 2021-02-05 NOTE — Progress Notes (Signed)
   Subjective:    Patient ID: Elizabeth Dickerson, female    DOB: 11-20-1950, 70 y.o.   MRN: [id number]  Elizabeth Dickerson, female    DOB: 1951-03-06, 70 y.o.   MRN: [id number]   Chief Complaint: Eye Pain (Feels like something in it/)   HPI Patient in says that feels like something is n her eye. Feels like a flim has come over it. Very irritated. Watery with blurred vision. Eye lashes were not matted together. Started 5 days ago   Review of Systems  Eyes: Positive for pain, redness, itching and visual disturbance. Negative for photophobia and discharge.  All other systems reviewed and are negative.   Review of Systems  Eyes: Positive for pain, discharge, redness, itching and visual disturbance. Negative for photophobia.  All other systems reviewed and are negative.      Objective:   Physical Exam Constitutional:      Appearance: Normal appearance.  Eyes:     Conjunctiva/sclera: Conjunctivae normal.     Pupils: Pupils are equal, round, and reactive to light.     Comments: Mild scleral injection Watery No foreign body or laceration visible  Cardiovascular:     Rate and Rhythm: Normal rate and regular rhythm.     Heart sounds: Normal heart sounds.  Pulmonary:     Breath sounds: Normal breath sounds.  Skin:    General: Skin is warm.  Neurological:     General: No focal deficit present.     Mental Status: She is alert.  Psychiatric:        Mood and Affect: Mood normal.     BP (!) 202/94   Pulse (!) 113   Temp 97.9 F (36.6 C) (Temporal)   Resp 20   Ht 5\' 7"  (1.702 m)   Wt 81 lb (36.7 kg)   SpO2 97%   BMI 12.69 kg/m        Assessment & Plan:  Elizabeth Dickerson in today with chief complaint of Eye Pain (Feels like something in it/)   1. Pain of right eye Cool compresses Try to avoid rubbing Meds ordered this encounter  Medications  . tobramycin-dexamethasone (TOBRADEX) ophthalmic ointment    Sig: Place 1 application into the right eye 3 (three) times daily.     Dispense:  3.5 g    Refill:  0    Order Specific Question:   Supervising Provider    Answer:   Caryl Pina A [[id number]]       The above assessment and management plan was discussed with the patient. The patient verbalized understanding of and has agreed to the management plan. Patient is aware to call the clinic if symptoms persist or worsen. Patient is aware when to return to the clinic for a follow-up visit. Patient educated on when it is appropriate to go to the emergency department.   Mary-Margaret Hassell Done, FNP

## 2021-02-05 NOTE — Addendum Note (Signed)
Addended by: Chevis Pretty on: 02/05/2021 02:35 PM   Modules accepted: Orders

## 2021-02-08 ENCOUNTER — Telehealth: Payer: Self-pay | Admitting: *Deleted

## 2021-02-08 MED ORDER — ALBUTEROL SULFATE HFA 108 (90 BASE) MCG/ACT IN AERS: 2.0000 | INHALATION_SPRAY | Freq: Four times a day (QID) | RESPIRATORY_TRACT | 0 refills | Status: DC | PRN

## 2021-02-08 NOTE — Addendum Note (Signed)
Addended by: Chevis Pretty on: 02/08/2021 04:35 PM   Modules accepted: Orders

## 2021-02-08 NOTE — Telephone Encounter (Signed)
Fax from [facility]: Albuterol (Ventolin HFA) Ins prefers Proair, can this be changed & new Rx sent in

## 2021-09-03 ENCOUNTER — Ambulatory Visit (INDEPENDENT_AMBULATORY_CARE_PROVIDER_SITE_OTHER): Payer: Medicare HMO | Admitting: Nurse Practitioner

## 2021-09-03 ENCOUNTER — Emergency Department (HOSPITAL_COMMUNITY): Payer: Medicare HMO

## 2021-09-03 ENCOUNTER — Encounter: Payer: Self-pay | Admitting: Nurse Practitioner

## 2021-09-03 ENCOUNTER — Emergency Department (HOSPITAL_COMMUNITY)
Admission: EM | Admit: 2021-09-03 | Discharge: 2021-09-03 | Disposition: A | Discharge status: Home / Self Care | Payer: Medicare HMO | Attending: Emergency Medicine | Admitting: Emergency Medicine

## 2021-09-03 ENCOUNTER — Encounter (HOSPITAL_COMMUNITY): Payer: Self-pay

## 2021-09-03 ENCOUNTER — Other Ambulatory Visit: Payer: Self-pay

## 2021-09-03 DIAGNOSIS — M549 Dorsalgia, unspecified: Secondary | ICD-10-CM | POA: Diagnosis not present

## 2021-09-03 DIAGNOSIS — E049 Nontoxic goiter, unspecified: Secondary | ICD-10-CM | POA: Diagnosis not present

## 2021-09-03 DIAGNOSIS — R410 Disorientation, unspecified: Secondary | ICD-10-CM | POA: Diagnosis not present

## 2021-09-03 DIAGNOSIS — F17210 Nicotine dependence, cigarettes, uncomplicated: Secondary | ICD-10-CM | POA: Diagnosis not present

## 2021-09-03 DIAGNOSIS — N12 Tubulo-interstitial nephritis, not specified as acute or chronic: Secondary | ICD-10-CM | POA: Insufficient documentation

## 2021-09-03 DIAGNOSIS — R079 Chest pain, unspecified: Secondary | ICD-10-CM | POA: Diagnosis not present

## 2021-09-03 DIAGNOSIS — R1032 Left lower quadrant pain: Secondary | ICD-10-CM | POA: Diagnosis present

## 2021-09-03 DIAGNOSIS — R531 Weakness: Secondary | ICD-10-CM | POA: Diagnosis not present

## 2021-09-03 DIAGNOSIS — R0600 Dyspnea, unspecified: Secondary | ICD-10-CM | POA: Diagnosis not present

## 2021-09-03 DIAGNOSIS — R112 Nausea with vomiting, unspecified: Secondary | ICD-10-CM | POA: Diagnosis not present

## 2021-09-03 DIAGNOSIS — I10 Essential (primary) hypertension: Secondary | ICD-10-CM | POA: Diagnosis not present

## 2021-09-03 DIAGNOSIS — I701 Atherosclerosis of renal artery: Secondary | ICD-10-CM | POA: Diagnosis not present

## 2021-09-03 DIAGNOSIS — I6782 Cerebral ischemia: Secondary | ICD-10-CM | POA: Diagnosis not present

## 2021-09-03 DIAGNOSIS — M2578 Osteophyte, vertebrae: Secondary | ICD-10-CM | POA: Diagnosis not present

## 2021-09-03 DIAGNOSIS — Z91199 Patient's noncompliance with other medical treatment and regimen due to unspecified reason: Secondary | ICD-10-CM

## 2021-09-03 DIAGNOSIS — M47812 Spondylosis without myelopathy or radiculopathy, cervical region: Secondary | ICD-10-CM | POA: Diagnosis not present

## 2021-09-03 LAB — COMPREHENSIVE METABOLIC PANEL
ALT: 17 U/L (ref 0–44)
AST: 26 U/L (ref 15–41)
Albumin: 3.7 g/dL (ref 3.5–5.0)
Alkaline Phosphatase: 81 U/L (ref 38–126)
Anion gap: 12 (ref 5–15)
BUN: 15 mg/dL (ref 8–23)
CO2: 21 mmol/L — ABNORMAL LOW (ref 22–32)
Calcium: 8.9 mg/dL (ref 8.9–10.3)
Chloride: 103 mmol/L (ref 98–111)
Creatinine, Ser: 1.02 mg/dL — ABNORMAL HIGH (ref 0.44–1.00)
GFR, Estimated: 60 mL/min — ABNORMAL LOW (ref >60)
Glucose, Bld: 115 mg/dL — ABNORMAL HIGH (ref 70–99)
Potassium: 3.3 mmol/L — ABNORMAL LOW (ref 3.5–5.1)
Sodium: 136 mmol/L (ref 135–145)
Total Bilirubin: 1 mg/dL (ref 0.3–1.2)
Total Protein: 7 g/dL (ref 6.5–8.1)

## 2021-09-03 LAB — CBC
HCT: 38.3 % (ref 36.0–46.0)
Hemoglobin: 12.6 g/dL (ref 12.0–15.0)
MCH: 27.9 pg (ref 26.0–34.0)
MCHC: 32.9 g/dL (ref 30.0–36.0)
MCV: 84.9 fL (ref 80.0–100.0)
Platelets: 148 10*3/uL — ABNORMAL LOW (ref 150–400)
RBC: 4.51 MIL/uL (ref 3.87–5.11)
RDW: 15 % (ref 11.5–15.5)
WBC: 9.9 10*3/uL (ref 4.0–10.5)
nRBC: 0 % (ref 0.0–0.2)

## 2021-09-03 LAB — URINALYSIS, ROUTINE W REFLEX MICROSCOPIC
Bilirubin Urine: NEGATIVE
Glucose, UA: NEGATIVE mg/dL
Ketones, ur: 40 mg/dL — AB
Nitrite: POSITIVE — AB
Protein, ur: 100 mg/dL — AB
Specific Gravity, Urine: 1.025 (ref 1.005–1.030)
pH: 6 (ref 5.0–8.0)

## 2021-09-03 LAB — URINALYSIS, MICROSCOPIC (REFLEX): WBC, UA: >50 WBC/hpf (ref 0–5)

## 2021-09-03 LAB — TROPONIN I (HIGH SENSITIVITY)
Troponin I (High Sensitivity): 21 ng/L — ABNORMAL HIGH (ref <18)
Troponin I (High Sensitivity): 22 ng/L — ABNORMAL HIGH (ref <18)

## 2021-09-03 LAB — PROTIME-INR
INR: 1.1 (ref 0.8–1.2)
Prothrombin Time: 13.9 seconds (ref 11.4–15.2)

## 2021-09-03 LAB — CK: Total CK: 42 U/L (ref 38–234)

## 2021-09-03 MED ORDER — CEPHALEXIN 500 MG PO CAPS: 500.0000 mg | ORAL_CAPSULE | Freq: Three times a day (TID) | ORAL | 0 refills | Status: AC

## 2021-09-03 MED ORDER — SODIUM CHLORIDE 0.9 % IV BOLUS
500.0000 mL | Freq: Once | INTRAVENOUS | Status: AC
  Administered 2021-09-03: 500 mL via INTRAVENOUS

## 2021-09-03 MED ORDER — SODIUM CHLORIDE 0.9 % IV SOLN
1.0000 g | Freq: Once | INTRAVENOUS | Status: AC
  Administered 2021-09-03: 1 g via INTRAVENOUS
  Filled 2021-09-03: qty 10

## 2021-09-03 MED ORDER — IOHEXOL 350 MG/ML SOLN
100.0000 mL | Freq: Once | INTRAVENOUS | Status: AC | PRN
  Administered 2021-09-03: 100 mL via INTRAVENOUS

## 2021-09-03 NOTE — ED Triage Notes (Addendum)
Pt presents with flank pain that radiates to back on both sides. Pt has had n/v x1 day. Pt states she is weak and has some SOB as well.   BP high in triage. States has hasn't taken BP meds in over a week because she doesn't have any. 205/97.Marland Kitchen complaining of headache.

## 2021-09-03 NOTE — Progress Notes (Signed)
Called patient to complete telephone visit.  Patient reports that she has been to the emergency department the night prior and was treated for UTI and abdominal pain and will not need this visit.  Patient knows to call back with unresolved symptoms.

## 2021-09-03 NOTE — ED Notes (Signed)
Patient transported to CT 

## 2021-09-03 NOTE — Discharge Instructions (Signed)
You were evaluated in the Emergency Department and after careful evaluation, we did not find any emergent condition requiring admission or further testing in the hospital.  Your exam/testing today is overall reassuring.  Symptoms seem to be due to a kidney infection.  Please take the Keflex antibiotic as directed.  Follow-up closely with your regular doctor.  CT scan found a small thyroid nodule, recommend follow-up about this with your PCP.  Please return to the Emergency Department if you experience any worsening of your condition.   Thank you for allowing [place] to be a part of your care.

## 2021-09-03 NOTE — ED Notes (Signed)
Pt back from CT

## 2021-09-03 NOTE — ED Provider Notes (Signed)
[facility] Emergency Department Provider Note MRN:  [id number]  Arrival date & time: 09/03/21     Chief Complaint   Flank Pain   History of Present Illness   Elizabeth Dickerson is a 70 y.o. year-old female with a history of hypertension presenting to the ED with chief complaint of flank pain.  Location: Left lower Duration: 2 days Onset: Rash Timing: Constant Description: Dull Severity: Moderate Exacerbating/Alleviating Factors: None Associated Symptoms: None Pertinent Negatives: No hematuria, no dysuria, no fever, no chest pain or shortness of breath  Additional History: Has been unintentionally losing a lot of weight over the past year   Review of Systems  A complete 10 system review of systems was obtained and all systems are negative except as noted in the HPI and PMH.   Patient's Health History    Past Medical History:  Diagnosis Date   Anxiety    panic attack    Hypertension     Past Surgical History:  Procedure Laterality Date   BIOPSY  12/13/2019   Procedure: BIOPSY;  Surgeon: Danie Binder, MD;  [facility];  Service: Endoscopy;;  gastric   COLONOSCOPY N/A 12/13/2019   Procedure: COLONOSCOPY;  Surgeon: Danie Binder, MD;  [facility];  Service: Endoscopy;  Laterality: N/A;  7:30am   ESOPHAGOGASTRODUODENOSCOPY N/A 12/13/2019   Procedure: ESOPHAGOGASTRODUODENOSCOPY (EGD);  Surgeon: Danie Binder, MD;  [facility];  Service: Endoscopy;  Laterality: N/A;   POLYPECTOMY  12/13/2019   Procedure: POLYPECTOMY;  Surgeon: Danie Binder, MD;  [facility];  Service: Endoscopy;;  cecal, ascending, descending, sigmoid,splenic flexure   TUBAL LIGATION      Family History  Problem Relation Age of Onset   Hearing loss Brother    Cancer Sister        breast   Colon cancer Neg Hx    Gastric cancer Neg Hx    Esophageal cancer Neg Hx     Social History   Socioeconomic History   Marital status:  Married    Spouse name: Not on file   Number of children: Not on file   Years of education: Not on file   Highest education level: Not on file  Occupational History   Not on file  Tobacco Use   Smoking status: Every Day    Packs/day: 0.10    Years: 30.00    Pack years: 3.00    Types: Cigarettes   Smokeless tobacco: Never  Vaping Use   Vaping Use: Never used  Substance and Sexual Activity   Alcohol use: Not Currently   Drug use: Yes    Frequency: 0.5 times per week    Types: Marijuana    Comment: Marijuana about twice a month (as of 11/28/19)   Sexual activity: Not on file  Other Topics Concern   Not on file  Social History Narrative   Not on file   Social Determinants of Health   Financial Resource Strain: Not on file  Food Insecurity: Not on file  Transportation Needs: Not on file  Physical Activity: Not on file  Stress: Not on file  Social Connections: Not on file  Intimate Partner Violence: Not on file     Physical Exam   Vitals:   09/03/21 0330 09/03/21 0400  BP: (!) 159/91 (!) 153/85  Pulse: 89   Resp: (!) 26 19  Temp:    SpO2: 98%     CONSTITUTIONAL: Well-appearing, NAD NEURO:  Alert and oriented x 3, no focal deficits EYES:  eyes equal and reactive ENT/NECK:  no LAD, no JVD CARDIO: Regular rate, well-perfused, normal S1 and S2 PULM:  CTAB no wheezing or rhonchi GI/GU:  normal bowel sounds, non-distended, non-tender MSK/SPINE:  No gross deformities, no edema SKIN:  no rash, atraumatic PSYCH:  Appropriate speech and behavior  *Additional and/or pertinent findings included in MDM below  Diagnostic and Interventional Summary    EKG Interpretation  Date/Time:    Ventricular Rate:    PR Interval:    QRS Duration:   QT Interval:    QTC Calculation:   R Axis:     Text Interpretation:         Labs Reviewed  CBC - Abnormal; Notable for the following components:      Result Value   Platelets 148 (*)    All other components within normal  limits  COMPREHENSIVE METABOLIC PANEL - Abnormal; Notable for the following components:   Potassium 3.3 (*)    CO2 21 (*)    Glucose, Bld 115 (*)    Creatinine, Ser 1.02 (*)    GFR, Estimated 60 (*)    All other components within normal limits  URINALYSIS, ROUTINE W REFLEX MICROSCOPIC - Abnormal; Notable for the following components:   APPearance HAZY (*)    Hgb urine dipstick LARGE (*)    Ketones, ur 40 (*)    Protein, ur 100 (*)    Nitrite POSITIVE (*)    Leukocytes,Ua SMALL (*)    All other components within normal limits  URINALYSIS, MICROSCOPIC (REFLEX) - Abnormal; Notable for the following components:   Bacteria, UA MANY (*)    All other components within normal limits  TROPONIN I (HIGH SENSITIVITY) - Abnormal; Notable for the following components:   Troponin I (High Sensitivity) 21 (*)    All other components within normal limits  PROTIME-INR  CK  TROPONIN I (HIGH SENSITIVITY)    CT HEAD WO CONTRAST (5MM)  Final Result    CT CERVICAL SPINE WO CONTRAST  Final Result    CT Angio Chest/Abd/Pel for Dissection W and/or Wo Contrast  Final Result      Medications  cefTRIAXone (ROCEPHIN) 1 g in sodium chloride 0.9 % 100 mL IVPB (1 g Intravenous New Bag/Given 09/03/21 0405)  sodium chloride 0.9 % bolus 500 mL (0 mLs Intravenous Stopped 09/03/21 0403)  iohexol (OMNIPAQUE) 350 MG/ML injection 100 mL (100 mLs Intravenous Contrast Given 09/03/21 0253)     Procedures  /  Critical Care Procedures  ED Course and Medical Decision Making  I have reviewed the triage vital signs, the nursing notes, and pertinent available records from the EMR.  Listed above are laboratory and imaging tests that I personally ordered, reviewed, and interpreted and then considered in my medical decision making (see below for details).  Left flank pain, considering stone versus pyelonephritis versus dissection versus AAA.  Also considering underlying malignancy given patient's very thin appearance and  unintentional weight loss over the past year, has not really been evaluated for this.  CT imaging is reassuring, no evidence of malignancy.  Urinalysis and CT showing evidence of pyelonephritis.  Will provide antibiotics, seems to be doing well, normal vital signs, no fever, appropriate for discharge.       Barth Kirks. Sedonia Small, [place] [email or web address]  Final Clinical Impressions(s) / ED Diagnoses     ICD-10-CM   1. Pyelonephritis  N12  ED Discharge Orders     None        Discharge Instructions Discussed with and Provided to Patient:   Discharge Instructions   None       Maudie Flakes, MD 09/03/21 [phone]

## 2021-09-07 ENCOUNTER — Ambulatory Visit (INDEPENDENT_AMBULATORY_CARE_PROVIDER_SITE_OTHER): Payer: Medicare HMO | Admitting: Nurse Practitioner

## 2021-09-07 ENCOUNTER — Encounter: Payer: Self-pay | Admitting: Nurse Practitioner

## 2021-09-07 VITALS — BP 159/90 | HR 84 | Temp 97.0°F | Resp 20 | Ht 67.0 in | Wt 83.0 lb

## 2021-09-07 DIAGNOSIS — B3731 Acute candidiasis of vulva and vagina: Secondary | ICD-10-CM

## 2021-09-07 DIAGNOSIS — E041 Nontoxic single thyroid nodule: Secondary | ICD-10-CM | POA: Diagnosis not present

## 2021-09-07 DIAGNOSIS — I10 Essential (primary) hypertension: Secondary | ICD-10-CM

## 2021-09-07 DIAGNOSIS — R300 Dysuria: Secondary | ICD-10-CM

## 2021-09-07 LAB — MICROSCOPIC EXAMINATION: Renal Epithel, UA: NONE SEEN /hpf

## 2021-09-07 LAB — URINALYSIS, COMPLETE
Bilirubin, UA: NEGATIVE
Glucose, UA: NEGATIVE
Ketones, UA: NEGATIVE
Nitrite, UA: NEGATIVE
Protein,UA: NEGATIVE
RBC, UA: NEGATIVE
Specific Gravity, UA: 1.02 (ref 1.005–1.030)
Urobilinogen, Ur: 0.2 mg/dL (ref 0.2–1.0)
pH, UA: 6.5 (ref 5.0–7.5)

## 2021-09-07 MED ORDER — AMLODIPINE BESYLATE 5 MG PO TABS: 5.0000 mg | ORAL_TABLET | Freq: Every day | ORAL | 3 refills | Status: DC

## 2021-09-07 MED ORDER — FLUCONAZOLE 150 MG PO TABS: 150.0000 mg | ORAL_TABLET | Freq: Once | ORAL | 0 refills | Status: AC

## 2021-09-07 NOTE — Patient Instructions (Signed)

## 2021-09-07 NOTE — Progress Notes (Signed)
° °  Subjective:    Patient ID: Elizabeth Dickerson, female    DOB: 12-17-1950, 70 y.o.   MRN: [id number]   Chief Complaint: Pain from UTI and Growth on thyroid   HPI: Patient comes in today with several complaints: - patient went to the urgent care pm 09/03/21 and was dx with pyelonephritis. She was given keflex. Still having  burning when she urinates. And she is having perineal itching. - they did CT scan while in ED and was found to have  thyroid nodules and needs U/S ordered. She denies any difficulity swallowing. - patient suppose to be on  norvasc but she ran out of meds.  Review of Systems  Constitutional:  Negative for diaphoresis.  Eyes:  Negative for pain.  Respiratory:  Negative for shortness of breath.   Cardiovascular:  Negative for chest pain, palpitations and leg swelling.  Gastrointestinal:  Negative for abdominal pain.  Endocrine: Negative for polydipsia and polyphagia.  Skin:  Negative for rash.  Neurological:  Negative for dizziness, weakness and headaches.  Hematological:  Does not bruise/bleed easily.  All other systems reviewed and are negative.     Objective:   Physical Exam Constitutional:      Appearance: Normal appearance.  Cardiovascular:     Rate and Rhythm: Normal rate and regular rhythm.     Heart sounds: Normal heart sounds.  Pulmonary:     Effort: Pulmonary effort is normal.     Breath sounds: Normal breath sounds.  Abdominal:     Tenderness: There is no right CVA tenderness or left CVA tenderness.  Skin:    General: Skin is warm.  Neurological:     General: No focal deficit present.     Mental Status: She is alert and oriented to person, place, and time.  Psychiatric:        Mood and Affect: Mood normal.        Behavior: Behavior normal.   BP (!) 159/90    Pulse 84    Temp (!) 97 F (36.1 C) (Temporal)    Resp 20    Ht 5\' 7"  (1.702 m)    Wt 83 lb (37.6 kg)    SpO2 99%    BMI 13.00 kg/m    U/A clearing       Assessment & Plan:    Elizabeth Dickerson in today with chief complaint of Pain from UTI and Growth on thyroid   1. Dysuria Urine clearing - Urinalysis, Complete  2. Vaginal candidiasis No douching'no bubble bath - fluconazole (DIFLUCAN) 150 MG tablet; Take 1 tablet (150 mg total) by mouth once for 1 dose.  Dispense: 1 tablet; Refill: 0  3. Essential hypertension Get back on blood pressure meds - amLODipine (NORVASC) 5 MG tablet; Take 1 tablet (5 mg total) by mouth daily.  Dispense: 90 tablet; Refill: 3  4. Thyroid nodule Will talk once results are back Hospital records reviewed - US THYROID; Future    The above assessment and management plan was discussed with the patient. The patient verbalized understanding of and has agreed to the management plan. Patient is aware to call the clinic if symptoms persist or worsen. Patient is aware when to return to the clinic for a follow-up visit. Patient educated on when it is appropriate to go to the emergency department.   Mary-Margaret Hassell Done, FNP

## 2021-09-15 ENCOUNTER — Other Ambulatory Visit: Payer: Self-pay

## 2021-09-15 ENCOUNTER — Ambulatory Visit (HOSPITAL_COMMUNITY)
Admission: RE | Admit: 2021-09-15 | Discharge: 2021-09-15 | Disposition: A | Discharge status: Home / Self Care | Payer: Medicare HMO | Source: Ambulatory Visit | Attending: Nurse Practitioner | Admitting: Nurse Practitioner

## 2021-09-15 DIAGNOSIS — E041 Nontoxic single thyroid nodule: Secondary | ICD-10-CM | POA: Diagnosis not present

## 2021-09-16 ENCOUNTER — Other Ambulatory Visit: Payer: Self-pay

## 2021-09-16 DIAGNOSIS — E041 Nontoxic single thyroid nodule: Secondary | ICD-10-CM

## 2021-09-23 ENCOUNTER — Ambulatory Visit (HOSPITAL_COMMUNITY): Admission: RE | Admit: 2021-09-23 | Payer: Medicare HMO | Source: Ambulatory Visit

## 2021-09-29 ENCOUNTER — Ambulatory Visit (HOSPITAL_COMMUNITY)
Admission: RE | Admit: 2021-09-29 | Discharge: 2021-09-29 | Disposition: A | Discharge status: Home / Self Care | Payer: Medicare Other | Source: Ambulatory Visit | Attending: Nurse Practitioner | Admitting: Nurse Practitioner

## 2021-09-29 ENCOUNTER — Other Ambulatory Visit: Payer: Self-pay

## 2021-09-29 ENCOUNTER — Encounter (HOSPITAL_COMMUNITY): Payer: Self-pay

## 2021-09-29 DIAGNOSIS — E041 Nontoxic single thyroid nodule: Secondary | ICD-10-CM | POA: Insufficient documentation

## 2021-09-29 MED ORDER — LIDOCAINE HCL (PF) 2 % IJ SOLN
INTRAMUSCULAR | Status: AC
  Administered 2021-09-29: 10 mL
  Filled 2021-09-29: qty 10

## 2021-09-29 MED ORDER — LIDOCAINE HCL (PF) 2 % IJ SOLN
10.0000 mL | Freq: Once | INTRAMUSCULAR | Status: AC
Start: 2021-09-29 — End: 2021-09-29

## 2021-09-29 NOTE — Progress Notes (Signed)
PT tolerated thyroid biopsy procedure well today. Labs and afirma obtained and sent for pathology by ultrasound tech at this time. PT ambulatory at discharge with no acute distress noted and verbalized understanding of discharge instructions.

## 2021-09-30 LAB — CYTOLOGY - NON PAP

## 2021-10-06 ENCOUNTER — Telehealth: Payer: Self-pay | Admitting: Nurse Practitioner

## 2021-10-06 NOTE — Telephone Encounter (Signed)
According to report was benign nodule- nothing to worry about!

## 2021-10-06 NOTE — Telephone Encounter (Signed)
Pt aware of benign nodule and voiced understanding.

## 2021-10-12 ENCOUNTER — Telehealth: Payer: Self-pay | Admitting: Nurse Practitioner

## 2021-10-15 ENCOUNTER — Ambulatory Visit (INDEPENDENT_AMBULATORY_CARE_PROVIDER_SITE_OTHER): Payer: Medicare Other

## 2021-10-15 VITALS — Ht 67.0 in | Wt 95.0 lb

## 2021-10-15 DIAGNOSIS — Z0000 Encounter for general adult medical examination without abnormal findings: Secondary | ICD-10-CM

## 2021-10-15 NOTE — Progress Notes (Signed)
Subjective:   Elizabeth Dickerson is a 71 y.o. female who presents for an Initial Medicare Annual Wellness Visit.  Virtual Visit via Telephone Note  I connected with  Elizabeth Dickerson on 10/15/21 at  9:45 AM EST by telephone and verified that I am speaking with the correct person using two identifiers.  Location: Patient: Home Provider: [facility] Persons participating in the virtual visit: patient/Nurse Health Advisor   I discussed the limitations, risks, security and privacy concerns of performing an evaluation and management service by telephone and the availability of in person appointments. The patient expressed understanding and agreed to proceed.  Interactive audio and video telecommunications were attempted between this nurse and patient, however failed, due to patient having technical difficulties OR patient did not have access to video capability.  We continued and completed visit with audio only.  Some vital signs may be absent or patient reported.   Elena Cothern E Bueford Arp, LPN   Review of Systems     Cardiac Risk Factors include: advanced age (>49men, >67 women);sedentary lifestyle;hypertension;smoking/ tobacco exposure     Objective:    Today's Vitals   10/15/21 0946  Weight: 95 lb (43.1 kg)  Height: 5\' 7"  (1.702 m)   Body mass index is 14.88 kg/m.  Advanced Directives 10/15/2021 09/03/2021 12/13/2019  Does Patient Have a Medical Advance Directive? No No No  Would patient like information on creating a medical advance directive? No - Patient declined - -    Current Medications (verified) Outpatient Encounter Medications as of 10/15/2021  Medication Sig   albuterol (VENTOLIN HFA) 108 (90 Base) MCG/ACT inhaler Inhale 2 puffs into the lungs every 6 (six) hours as needed for wheezing or shortness of breath.   amLODipine (NORVASC) 5 MG tablet Take 1 tablet (5 mg total) by mouth daily.   [DISCONTINUED] escitalopram (LEXAPRO) 5 MG tablet Take 1 tablet (5 mg total) by mouth daily.    No facility-administered encounter medications on file as of 10/15/2021.    Allergies (verified) Patient has no known allergies.   History: Past Medical History:  Diagnosis Date   Anxiety    panic attack    Hypertension    Past Surgical History:  Procedure Laterality Date   BIOPSY  12/13/2019   Procedure: BIOPSY;  Surgeon: Danie Binder, MD;  [facility];  Service: Endoscopy;;  gastric   COLONOSCOPY N/A 12/13/2019   Procedure: COLONOSCOPY;  Surgeon: Danie Binder, MD;  [facility];  Service: Endoscopy;  Laterality: N/A;  7:30am   ESOPHAGOGASTRODUODENOSCOPY N/A 12/13/2019   Procedure: ESOPHAGOGASTRODUODENOSCOPY (EGD);  Surgeon: Danie Binder, MD;  [facility]: [facility];  Service: Endoscopy;  Laterality: N/A;   POLYPECTOMY  12/13/2019   Procedure: POLYPECTOMY;  Surgeon: Danie Binder, MD;  Location: [facility];  Service: Endoscopy;;  cecal, ascending, descending, sigmoid,splenic flexure   TUBAL LIGATION     Family History  Problem Relation Age of Onset   Hearing loss Brother    Cancer Sister        breast   Colon cancer Neg Hx    Gastric cancer Neg Hx    Esophageal cancer Neg Hx    Social History   Socioeconomic History   Marital status: Widowed    Spouse name: Not on file   Number of children: 3   Years of education: Not on file   Highest education level: Not on file  Occupational History   Occupation: retired  Tobacco Use   Smoking  status: Every Day    Packs/day: 0.10    Years: 30.00    Pack years: 3.00    Types: Cigarettes   Smokeless tobacco: Never  Vaping Use   Vaping Use: Never used  Substance and Sexual Activity   Alcohol use: Not Currently   Drug use: Yes    Frequency: 0.5 times per week    Types: Marijuana    Comment: Marijuana about twice a month (as of 11/28/19)   Sexual activity: Not on file  Other Topics Concern   Not on file  Social History Narrative   Baby-sits great grandchild   Son lives with her    Daughters live in [place]   Social Determinants of Health   Financial Resource Strain: Low Risk    Difficulty of Paying Living Expenses: Not very hard  Food Insecurity: No Food Insecurity   Worried About Charity fundraiser in the Last Year: Never true   Arboriculturist in the Last Year: Never true  Transportation Needs: No Transportation Needs   Lack of Transportation (Medical): No   Lack of Transportation (Non-Medical): No  Physical Activity: Inactive   Days of Exercise per Week: 0 days   Minutes of Exercise per Session: 0 min  Stress: No Stress Concern Present   Feeling of Stress : Only a little  Social Connections: Socially Isolated   Frequency of Communication with Friends and Family: More than three times a week   Frequency of Social Gatherings with Friends and Family: More than three times a week   Attends Religious Services: Never   Marine scientist or Organizations: No   Attends Archivist Meetings: Never   Marital Status: Widowed    Tobacco Counseling Ready to quit: Not Answered Counseling given: Not Answered   Clinical Intake:  Pre-visit preparation completed: Yes  Pain : No/denies pain     BMI - recorded: 14.88 Nutritional Status: BMI <19  Underweight Nutritional Risks: Nausea/ vomitting/ diarrhea, Unintentional weight loss (nausea intermittent) Diabetes: No  How often do you need to have someone help you when you read instructions, pamphlets, or other written materials from your doctor or pharmacy?: 1 - Never  Diabetic? no  Interpreter Needed?: No  Information entered by :: Naiomi Musto, LPN   Activities of Daily Living In your present state of health, do you have any difficulty performing the following activities: 10/15/2021  Hearing? N  Vision? N  Difficulty concentrating or making decisions? N  Walking or climbing stairs? Y  Dressing or bathing? N  Doing errands, shopping? Y  Comment drives very little  Conservation officer, nature and eating ?  N  Using the Toilet? N  In the past six months, have you accidently leaked urine? Y  Comment urge incontinence  Do you have problems with loss of bowel control? N  Managing your Medications? N  Managing your Finances? N  Housekeeping or managing your Housekeeping? N  Some recent data might be hidden    Patient Care Team: Chevis Pretty, FNP as PCP - General (Family Medicine) Eloise Harman, DO as Consulting Physician (Internal Medicine)  Indicate any recent Medical Services you may have received from other than [facility] providers in the past year (date may be approximate).     Assessment:   This is a routine wellness examination for Elizabeth Dickerson.  Hearing/Vision screen Hearing Screening - Comments:: Denies hearing difficulties  Vision Screening - Comments:: Denies vision difficulties - behind on annual eye exams - has appt  with [facility]  Dietary issues and exercise activities discussed: Current Exercise Habits: The patient does not participate in regular exercise at present, Exercise limited by: None identified   Goals Addressed             This Visit's Progress    Patient Stated       Gain weight, feel better, get active       Depression Screen PHQ 2/9 Scores 10/15/2021 09/07/2021 11/25/2019 10/28/2019 10/28/2019 04/16/2019 10/16/2018  PHQ - 2 Score 0 0 0 0 0 2 5  PHQ- 9 Score 4 4 - 0 - 4 18    Fall Risk Fall Risk  10/15/2021 09/07/2021 11/25/2019 10/28/2019  Falls in the past year? 0 0 0 0  Number falls in past yr: 0 - - -  Injury with Fall? 0 - - -  Risk for fall due to : No Fall Risks - - -  Follow up Falls prevention discussed - - -    FALL RISK PREVENTION PERTAINING TO THE HOME:  Any stairs in or around the home? No  If so, are there any without handrails? No  Home free of loose throw rugs in walkways, pet beds, electrical cords, etc? Yes  Adequate lighting in your home to reduce risk of falls? Yes   ASSISTIVE DEVICES UTILIZED TO PREVENT FALLS:  Life alert?  No  Use of a cane, walker or w/c? No  Grab bars in the bathroom? Yes  Shower chair or bench in shower? No  Elevated toilet seat or a handicapped toilet? No   TIMED UP AND GO:  Was the test performed? No . Telephonic visit  Cognitive Function:     6CIT Screen 10/15/2021  What Year? 0 points  What month? 0 points  What time? 0 points  Count back from 20 0 points  Months in reverse 4 points  Repeat phrase 4 points  Total Score 8    Immunizations  There is no immunization history on file for this patient.  TDAP status: Due, Education has been provided regarding the importance of this vaccine. Advised may receive this vaccine at local pharmacy or Health Dept. Aware to provide a copy of the vaccination record if obtained from local pharmacy or Health Dept. Verbalized acceptance and understanding.  Flu Vaccine status: Declined, Education has been provided regarding the importance of this vaccine but patient still declined. Advised may receive this vaccine at local pharmacy or Health Dept. Aware to provide a copy of the vaccination record if obtained from local pharmacy or Health Dept. Verbalized acceptance and understanding.  Pneumococcal vaccine status: Declined,  Education has been provided regarding the importance of this vaccine but patient still declined. Advised may receive this vaccine at local pharmacy or Health Dept. Aware to provide a copy of the vaccination record if obtained from local pharmacy or Health Dept. Verbalized acceptance and understanding.   Covid-19 vaccine status: Declined, Education has been provided regarding the importance of this vaccine but patient still declined. Advised may receive this vaccine at local pharmacy or Health Dept.or vaccine clinic. Aware to provide a copy of the vaccination record if obtained from local pharmacy or Health Dept. Verbalized acceptance and understanding.  Qualifies for Shingles Vaccine? Yes   Zostavax completed No   Shingrix  Completed?: No.    Education has been provided regarding the importance of this vaccine. Patient has been advised to call insurance company to determine out of pocket expense if they have not yet received this vaccine.  Advised may also receive vaccine at local pharmacy or Health Dept. Verbalized acceptance and understanding.  Screening Tests Health Maintenance  Topic Date Due   COVID-19 Vaccine (1) 10/31/2021 (Originally 06/02/1951)   INFLUENZA VACCINE  12/24/2021 (Originally 04/26/2021)   Zoster Vaccines- Shingrix (1 of 2) 01/13/2022 (Originally 12/01/2002)   Pneumonia Vaccine 6+ Years old (1 - PCV) 10/15/2022 (Originally 12/01/1956)   TETANUS/TDAP  10/15/2022 (Originally [birth year]01[birth year]1971)   DEXA SCAN  10/27/2021   MAMMOGRAM  11/04/2021   COLONOSCOPY (Pts 45-46yrs Insurance coverage will need to be confirmed)  12/12/2029   Hepatitis C Screening  Completed   HPV VACCINES  Aged Out    Health Maintenance  There are no preventive care reminders to display for this patient.   Colorectal cancer screening: Type of screening: Colonoscopy. Completed 12/13/2019. Repeat every 10 years  Mammogram status: Completed 2021. Repeat every year declined  Bone Density status: Completed 10/28/2019. Results reflect: Bone density results: OSTEOPOROSIS. Repeat every 2 years.  Lung Cancer Screening: (Low Dose CT Chest recommended if Age 68-80 years, 30 pack-year currently smoking OR have quit w/in 15years.) does not qualify.   Additional Screening:  Hepatitis C Screening: does qualify; Completed 10/16/2018  Vision Screening: Recommended annual ophthalmology exams for early detection of glaucoma and other disorders of the eye. Is the patient up to date with their annual eye exam?  No  Who is the provider or what is the name of the office in which the patient attends annual eye exams? [facility] If pt is not established with a provider, would they like to be referred to a provider to establish care? No .   Dental  Screening: Recommended annual dental exams for proper oral hygiene  Community Resource Referral / Chronic Care Management: CRR required this visit?  No   CCM required this visit?  No      Plan:     I have personally reviewed and noted the following in the patients chart:   Medical and social history Use of alcohol, tobacco or illicit drugs  Current medications and supplements including opioid prescriptions. Patient is not currently taking opioid prescriptions. Functional ability and status Nutritional status Physical activity Advanced directives List of other physicians Hospitalizations, surgeries, and ER visits in previous 12 months Vitals Screenings to include cognitive, depression, and falls Referrals and appointments  In addition, I have reviewed and discussed with patient certain preventive protocols, quality metrics, and best practice recommendations. A written personalized care plan for preventive services as well as general preventive health recommendations were provided to patient.     Sandrea Hammond, LPN   [id number]/[id number]   Nurse Notes: None

## 2021-10-15 NOTE — Patient Instructions (Signed)
Elizabeth Dickerson , Thank you for taking time to come for your Medicare Wellness Visit. I appreciate your ongoing commitment to your health goals. Please review the following plan we discussed and let me know if I can assist you in the future.   Screening recommendations/referrals: Colonoscopy: Done 12/13/2019 -Repeat in 10 years Mammogram: Done 2021 - declined repeat at this time Bone Density: Done 10/28/2019 - Repeat every 2 years  Recommended yearly ophthalmology/optometry visit for glaucoma screening and checkup Recommended yearly dental visit for hygiene and checkup  Vaccinations: Influenza vaccine: Due Pneumococcal vaccine: Due Tdap vaccine: Due Shingles vaccine: Due   Covid-19:Due  Advanced directives: Advance directive discussed with you today. Even though you declined this today, please call our office should you change your mind, and we can give you the proper paperwork for you to fill out.   Conditions/risks identified: Aim for 30 minutes of exercise or brisk walking each day, drink 6-8 glasses of water and eat lots of fruits and vegetables.   Next appointment: Follow up in one year for your annual wellness visit    Preventive Care 65 Years and Older, Female Preventive care refers to lifestyle choices and visits with your health care provider that can promote health and wellness. What does preventive care include? A yearly physical exam. This is also called an annual well check. Dental exams once or twice a year. Routine eye exams. Ask your health care provider how often you should have your eyes checked. Personal lifestyle choices, including: Daily care of your teeth and gums. Regular physical activity. Eating a healthy diet. Avoiding tobacco and drug use. Limiting alcohol use. Practicing safe sex. Taking low-dose aspirin every day. Taking vitamin and mineral supplements as recommended by your health care provider. What happens during an annual well check? The services and  screenings done by your health care provider during your annual well check will depend on your age, overall health, lifestyle risk factors, and family history of disease. Counseling  Your health care provider may ask you questions about your: Alcohol use. Tobacco use. Drug use. Emotional well-being. Home and relationship well-being. Sexual activity. Eating habits. History of falls. Memory and ability to understand (cognition). Work and work Statistician. Reproductive health. Screening  You may have the following tests or measurements: Height, weight, and BMI. Blood pressure. Lipid and cholesterol levels. These may be checked every 5 years, or more frequently if you are over 59 years old. Skin check. Lung cancer screening. You may have this screening every year starting at age 70 if you have a 30-pack-year history of smoking and currently smoke or have quit within the past 15 years. Fecal occult blood test (FOBT) of the stool. You may have this test every year starting at age 72. Flexible sigmoidoscopy or colonoscopy. You may have a sigmoidoscopy every 5 years or a colonoscopy every 10 years starting at age 82. Hepatitis C blood test. Hepatitis B blood test. Sexually transmitted disease (STD) testing. Diabetes screening. This is done by checking your blood sugar (glucose) after you have not eaten for a while (fasting). You may have this done every 1-3 years. Bone density scan. This is done to screen for osteoporosis. You may have this done starting at age 41. Mammogram. This may be done every 1-2 years. Talk to your health care provider about how often you should have regular mammograms. Talk with your health care provider about your test results, treatment options, and if necessary, the need for more tests. Vaccines  Your health  care provider may recommend certain vaccines, such as: Influenza vaccine. This is recommended every year. Tetanus, diphtheria, and acellular pertussis (Tdap,  Td) vaccine. You may need a Td booster every 10 years. Zoster vaccine. You may need this after age 27. Pneumococcal 13-valent conjugate (PCV13) vaccine. One dose is recommended after age 62. Pneumococcal polysaccharide (PPSV23) vaccine. One dose is recommended after age 104. Talk to your health care provider about which screenings and vaccines you need and how often you need them. This information is not intended to replace advice given to you by your health care provider. Make sure you discuss any questions you have with your health care provider. Document Released: 10/09/2015 Document Revised: 06/01/2016 Document Reviewed: 07/14/2015 Elsevier Interactive Patient Education  2017 [place] Prevention in the Home Falls can cause injuries. They can happen to people of all ages. There are many things you can do to make your home safe and to help prevent falls. What can I do on the outside of my home? Regularly fix the edges of walkways and driveways and fix any cracks. Remove anything that might make you trip as you walk through a door, such as a raised step or threshold. Trim any bushes or trees on the path to your home. Use bright outdoor lighting. Clear any walking paths of anything that might make someone trip, such as rocks or tools. Regularly check to see if handrails are loose or broken. Make sure that both sides of any steps have handrails. Any raised decks and porches should have guardrails on the edges. Have any leaves, snow, or ice cleared regularly. Use sand or salt on walking paths during winter. Clean up any spills in your garage right away. This includes oil or grease spills. What can I do in the bathroom? Use night lights. Install grab bars by the toilet and in the tub and shower. Do not use towel bars as grab bars. Use non-skid mats or decals in the tub or shower. If you need to sit down in the shower, use a plastic, non-slip stool. Keep the floor dry. Clean up any  water that spills on the floor as soon as it happens. Remove soap buildup in the tub or shower regularly. Attach bath mats securely with double-sided non-slip rug tape. Do not have throw rugs and other things on the floor that can make you trip. What can I do in the bedroom? Use night lights. Make sure that you have a light by your bed that is easy to reach. Do not use any sheets or blankets that are too big for your bed. They should not hang down onto the floor. Have a firm chair that has side arms. You can use this for support while you get dressed. Do not have throw rugs and other things on the floor that can make you trip. What can I do in the kitchen? Clean up any spills right away. Avoid walking on wet floors. Keep items that you use a lot in easy-to-reach places. If you need to reach something above you, use a strong step stool that has a grab bar. Keep electrical cords out of the way. Do not use floor polish or wax that makes floors slippery. If you must use wax, use non-skid floor wax. Do not have throw rugs and other things on the floor that can make you trip. What can I do with my stairs? Do not leave any items on the stairs. Make sure that there are handrails on  both sides of the stairs and use them. Fix handrails that are broken or loose. Make sure that handrails are as long as the stairways. Check any carpeting to make sure that it is firmly attached to the stairs. Fix any carpet that is loose or worn. Avoid having throw rugs at the top or bottom of the stairs. If you do have throw rugs, attach them to the floor with carpet tape. Make sure that you have a light switch at the top of the stairs and the bottom of the stairs. If you do not have them, ask someone to add them for you. What else can I do to help prevent falls? Wear shoes that: Do not have high heels. Have rubber bottoms. Are comfortable and fit you well. Are closed at the toe. Do not wear sandals. If you use a  stepladder: Make sure that it is fully opened. Do not climb a closed stepladder. Make sure that both sides of the stepladder are locked into place. Ask someone to hold it for you, if possible. Clearly mark and make sure that you can see: Any grab bars or handrails. First and last steps. Where the edge of each step is. Use tools that help you move around (mobility aids) if they are needed. These include: Canes. Walkers. Scooters. Crutches. Turn on the lights when you go into a dark area. Replace any light bulbs as soon as they burn out. Set up your furniture so you have a clear path. Avoid moving your furniture around. If any of your floors are uneven, fix them. If there are any pets around you, be aware of where they are. Review your medicines with your doctor. Some medicines can make you feel dizzy. This can increase your chance of falling. Ask your doctor what other things that you can do to help prevent falls. This information is not intended to replace advice given to you by your health care provider. Make sure you discuss any questions you have with your health care provider. Document Released: 07/09/2009 Document Revised: 02/18/2016 Document Reviewed: 10/17/2014 Elsevier Interactive Patient Education  2017 [employer].

## 2022-01-12 ENCOUNTER — Ambulatory Visit (INDEPENDENT_AMBULATORY_CARE_PROVIDER_SITE_OTHER): Payer: Medicare Other | Admitting: Family Medicine

## 2022-01-12 ENCOUNTER — Encounter: Payer: Self-pay | Admitting: Family Medicine

## 2022-01-12 DIAGNOSIS — J40 Bronchitis, not specified as acute or chronic: Secondary | ICD-10-CM | POA: Diagnosis not present

## 2022-01-12 MED ORDER — PREDNISONE 20 MG PO TABS: ORAL_TABLET | ORAL | 0 refills | Status: DC

## 2022-01-12 MED ORDER — GUAIFENESIN ER 600 MG PO TB12
600.0000 mg | ORAL_TABLET | Freq: Two times a day (BID) | ORAL | 0 refills | Status: AC
Start: 2022-01-12 — End: 2022-01-22

## 2022-01-12 MED ORDER — ALBUTEROL SULFATE HFA 108 (90 BASE) MCG/ACT IN AERS: 2.0000 | INHALATION_SPRAY | Freq: Four times a day (QID) | RESPIRATORY_TRACT | 0 refills | Status: AC | PRN

## 2022-01-12 NOTE — Progress Notes (Signed)
? ?Virtual Visit via telephone Note ?Due to COVID-19 pandemic this visit was conducted virtually. This visit type was conducted due to national recommendations for restrictions regarding the COVID-19 Pandemic (e.g. social distancing, sheltering in place) in an effort to limit this patient's exposure and mitigate transmission in our community. All issues noted in this document were discussed and addressed.  A physical exam was not performed with this format.  ? ?I connected with Elizabeth Dickerson on 01/12/2022 at 1115 by telephone and verified that I am speaking with the correct person using two identifiers. Elizabeth Dickerson is currently located at home and family is currently with them during visit. The provider, Monia Pouch, FNP is located in their office at time of visit. ? ?I discussed the limitations, risks, security and privacy concerns of performing an evaluation and management service by virtual visit and the availability of in person appointments. I also discussed with the patient that there may be a patient responsible charge related to this service. The patient expressed understanding and agreed to proceed. ? ?Subjective:  ?Patient ID: Elizabeth Dickerson, female    DOB: July 11, 1951, 71 y.o.   MRN: [id number] ? ?Chief Complaint:  Cough ? ? ?HPI: ?Elizabeth Dickerson is a 71 y.o. female presenting on 01/12/2022 for Cough ? ? ?Pt reports ongoing cough despite use of Flonase and Claritin. She denies fever, chills, shortness of breath, chest pain, or other associated symptoms. States she may have some slight wheezing.  ? ?Cough ?This is a recurrent problem. The current episode started 1 to 4 weeks ago. The problem has been waxing and waning. The cough is Non-productive. Associated symptoms include wheezing. Pertinent negatives include no chest pain, chills, ear congestion, ear pain, fever, headaches, heartburn, hemoptysis, myalgias, nasal congestion, postnasal drip, rash, rhinorrhea, sore throat, shortness of breath,  sweats or weight loss. Treatments tried: claritin and flonase. The treatment provided mild relief.  ? ? ?Relevant past medical, surgical, family, and social history reviewed and updated as indicated.  ?Allergies and medications reviewed and updated. ? ? ?Past Medical History:  ?Diagnosis Date  ? Anxiety   ? panic attack   ? Hypertension   ? ? ?Past Surgical History:  ?Procedure Laterality Date  ? BIOPSY  12/13/2019  ? Procedure: BIOPSY;  Surgeon: Danie Binder, MD;  Location: [facility];  Service: Endoscopy;;  gastric  ? COLONOSCOPY N/A 12/13/2019  ? Procedure: COLONOSCOPY;  Surgeon: Danie Binder, MD;  Location: [facility];  Service: Endoscopy;  Laterality: N/A;  7:30am  ? ESOPHAGOGASTRODUODENOSCOPY N/A 12/13/2019  ? Procedure: ESOPHAGOGASTRODUODENOSCOPY (EGD);  Surgeon: Danie Binder, MD;  Location: [facility];  Service: Endoscopy;  Laterality: N/A;  ? POLYPECTOMY  12/13/2019  ? Procedure: POLYPECTOMY;  Surgeon: Danie Binder, MD;  Location: [facility];  Service: Endoscopy;;  cecal, ascending, descending, sigmoid,splenic flexure  ? TUBAL LIGATION    ? ? ?Social History  ? ?Socioeconomic History  ? Marital status: Widowed  ?  Spouse name: Not on file  ? Number of children: 3  ? Years of education: Not on file  ? Highest education level: Not on file  ?Occupational History  ? Occupation: retired  ?Tobacco Use  ? Smoking status: Every Day  ?  Packs/day: 0.10  ?  Years: 30.00  ?  Pack years: 3.00  ?  Types: Cigarettes  ? Smokeless tobacco: Never  ?Vaping Use  ? Vaping Use: Never used  ?Substance and Sexual Activity  ? Alcohol use:  Not Currently  ? Drug use: Yes  ?  Frequency: 0.5 times per week  ?  Types: Marijuana  ?  Comment: Marijuana about twice a month (as of 11/28/19)  ? Sexual activity: Not on file  ?Other Topics Concern  ? Not on file  ?Social History Narrative  ? Baby-sits great grandchild  ? Son lives with her  ? Daughters live in [place]  ? ?Social Determinants of Health  ? ?Financial Resource  Strain: Low Risk   ? Difficulty of Paying Living Expenses: Not very hard  ?Food Insecurity: No Food Insecurity  ? Worried About Charity fundraiser in the Last Year: Never true  ? Ran Out of Food in the Last Year: Never true  ?Transportation Needs: No Transportation Needs  ? Lack of Transportation (Medical): No  ? Lack of Transportation (Non-Medical): No  ?Physical Activity: Inactive  ? Days of Exercise per Week: 0 days  ? Minutes of Exercise per Session: 0 min  ?Stress: No Stress Concern Present  ? Feeling of Stress : Only a little  ?Social Connections: Socially Isolated  ? Frequency of Communication with Friends and Family: More than three times a week  ? Frequency of Social Gatherings with Friends and Family: More than three times a week  ? Attends Religious Services: Never  ? Active Member of Clubs or Organizations: No  ? Attends Archivist Meetings: Never  ? Marital Status: Widowed  ?Intimate Partner Violence: Not At Risk  ? Fear of Current or Ex-Partner: No  ? Emotionally Abused: No  ? Physically Abused: No  ? Sexually Abused: No  ? ? ?Outpatient Encounter Medications as of 01/12/2022  ?Medication Sig  ? albuterol (VENTOLIN HFA) 108 (90 Base) MCG/ACT inhaler Inhale 2 puffs into the lungs every 6 (six) hours as needed for wheezing or shortness of breath.  ? guaiFENesin (MUCINEX) 600 MG 12 hr tablet Take 1 tablet (600 mg total) by mouth 2 (two) times daily for 10 days.  ? predniSONE (DELTASONE) 20 MG tablet 2 po at sametime daily for 5 days- start tomorrow  ? amLODipine (NORVASC) 5 MG tablet Take 1 tablet (5 mg total) by mouth daily.  ? [DISCONTINUED] albuterol (VENTOLIN HFA) 108 (90 Base) MCG/ACT inhaler Inhale 2 puffs into the lungs every 6 (six) hours as needed for wheezing or shortness of breath.  ? ?No facility-administered encounter medications on file as of 01/12/2022.  ? ? ?No Known Allergies ? ?Review of Systems  ?Constitutional:  Negative for activity change, appetite change, chills,  diaphoresis, fatigue, fever, unexpected weight change and weight loss.  ?HENT:  Negative for congestion, ear pain, postnasal drip, rhinorrhea, sore throat and voice change.   ?Respiratory:  Positive for cough and wheezing. Negative for apnea, hemoptysis, choking, chest tightness, shortness of breath and stridor.   ?Cardiovascular:  Negative for chest pain, palpitations and leg swelling.  ?Gastrointestinal:  Negative for abdominal pain and heartburn.  ?Genitourinary:  Negative for decreased urine volume and difficulty urinating.  ?Musculoskeletal:  Negative for myalgias.  ?Skin:  Negative for rash.  ?Neurological:  Negative for weakness and headaches.  ?Psychiatric/Behavioral:  Negative for confusion.   ?All other systems reviewed and are negative. ? ?   ? ? ?Observations/Objective: ?No vital signs or physical exam, this was a virtual health encounter.  ?Pt alert and oriented, answers all questions appropriately, and able to speak in full sentences.  ? ? ?Assessment and Plan: ?Elizabeth Dickerson was seen today for cough. ? ?Diagnoses and all orders  for this visit: ? ?Bronchitis ?Symptoms consistent with bronchitis. No indications of acute bacterial infection. Will treat with Mucinex, prednisone, and albuterol inhaler as prescribed. Pt aware to report any new, worsening, or persistent symptoms.  ?-     guaiFENesin (MUCINEX) 600 MG 12 hr tablet; Take 1 tablet (600 mg total) by mouth 2 (two) times daily for 10 days. ?-     predniSONE (DELTASONE) 20 MG tablet; 2 po at sametime daily for 5 days- start tomorrow ?-     albuterol (VENTOLIN HFA) 108 (90 Base) MCG/ACT inhaler; Inhale 2 puffs into the lungs every 6 (six) hours as needed for wheezing or shortness of breath. ? ? ? ? ?Follow Up Instructions: ?Return if symptoms worsen or fail to improve. ? ?  ?I discussed the assessment and treatment plan with the patient. The patient was provided an opportunity to ask questions and all were answered. The patient agreed with the plan and  demonstrated an understanding of the instructions. ?  ?The patient was advised to call back or seek an in-person evaluation if the symptoms worsen or if the condition fails to improve as anticipated. ? ?The above assessment

## 2022-03-24 LAB — TSH: TSH: 0.1 — AB (ref 0.41–5.90)

## 2022-06-19 IMAGING — CT CT HEAD W/O CM
3 series · 14 of 46 positions shown, 16 images · non-contrast
Comparison: None.

CLINICAL DATA: Confusion, possible metastatic disease

EXAM:
CT HEAD WITHOUT CONTRAST
CT CERVICAL SPINE WITHOUT CONTRAST
TECHNIQUE: Multidetector CT imaging of the head and cervical spine was
performed following the standard protocol without intravenous
contrast. Multiplanar CT image reconstructions of the cervical spine
were also generated.

[Series 2: head w o · axial · 0.39mm/px · z∈[+57,+177]mm · 8 of 29 slices shown, 10 images]
[im 3/29  brain]
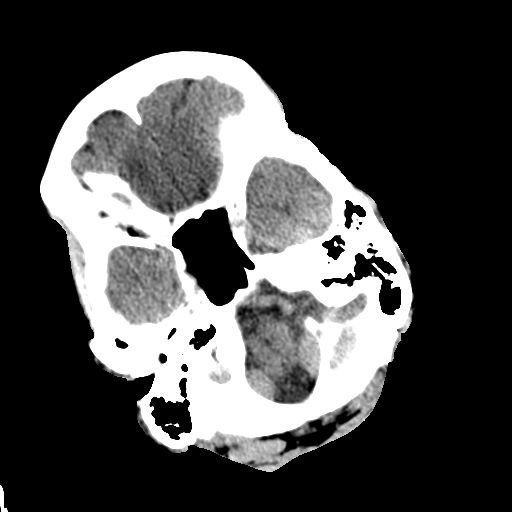
[im 3/29  bone]
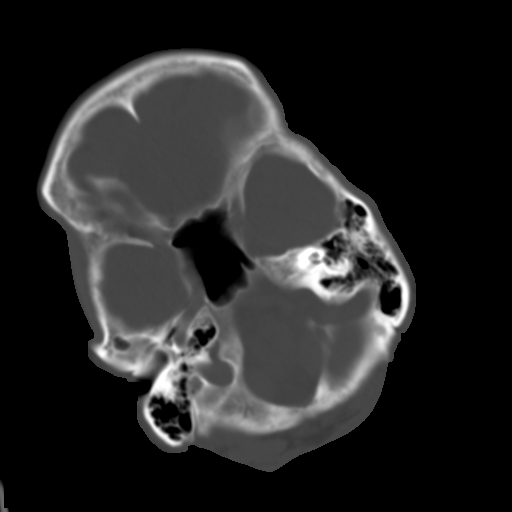
[im 7/29  brain]
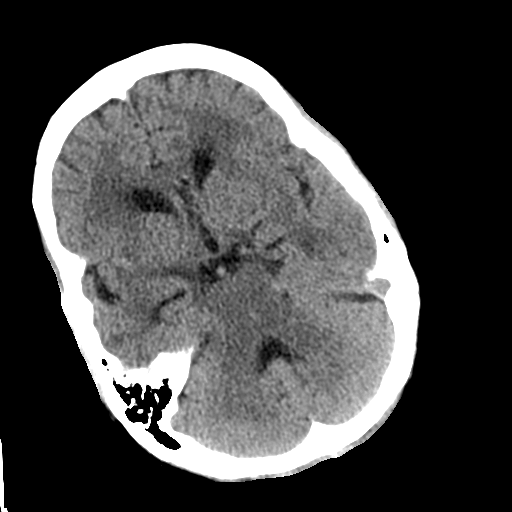
[im 10/29  brain]
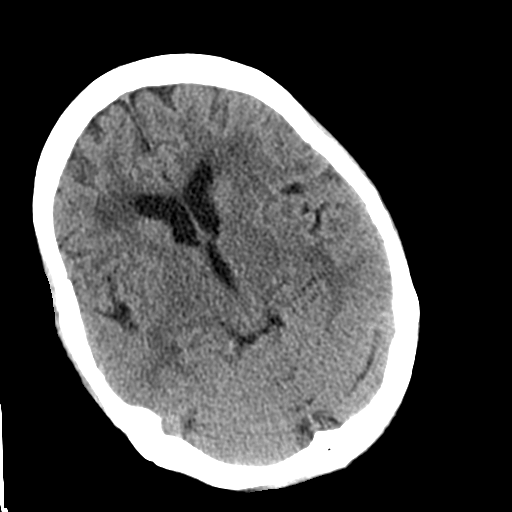
[im 13/29  brain]
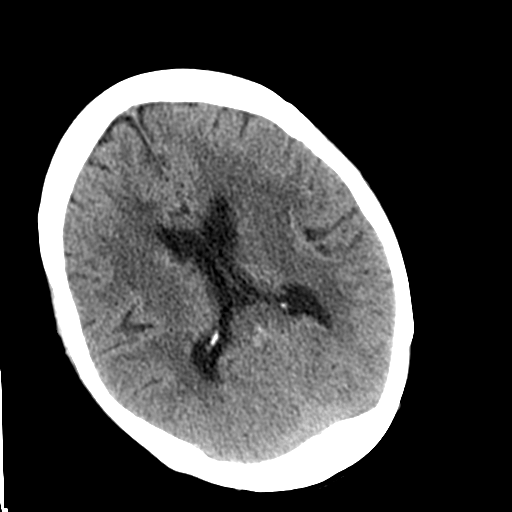
[im 17/29  brain]
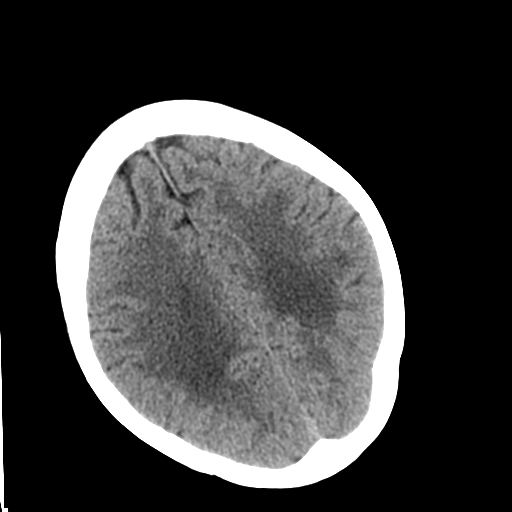
[im 17/29  bone]
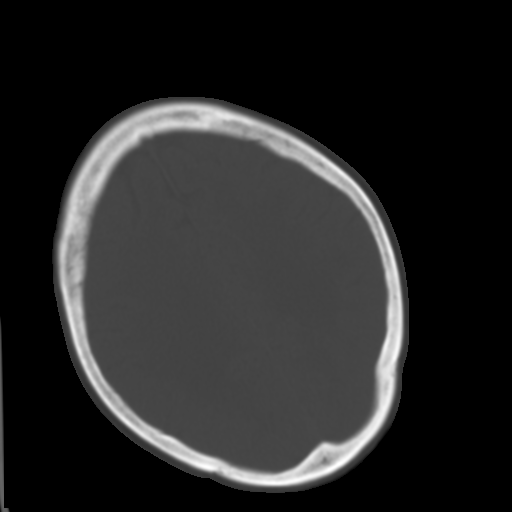
[im 20/29  brain]
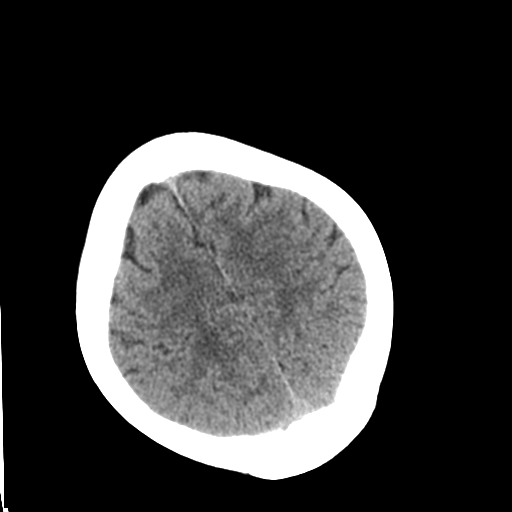
[im 23/29  brain]
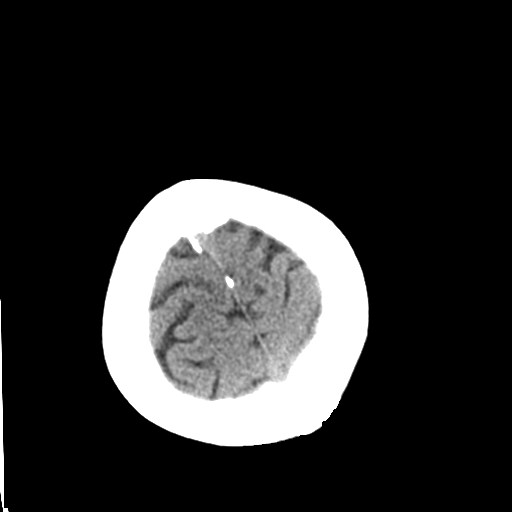
[im 27/29  brain]
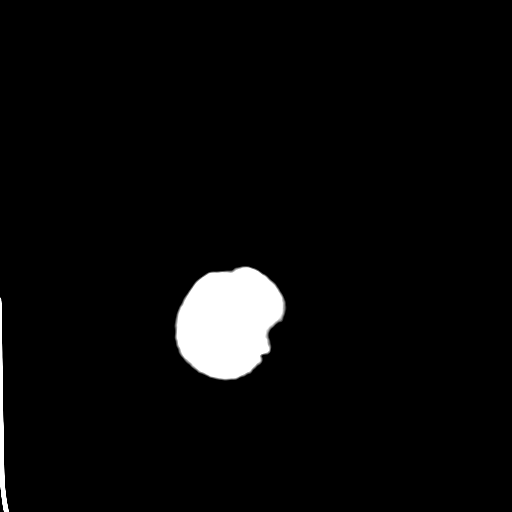

[Series 4: coronal soft · coronal · 0.28mm/px · 3 of 64 slices shown]
[im 22/64  brain]
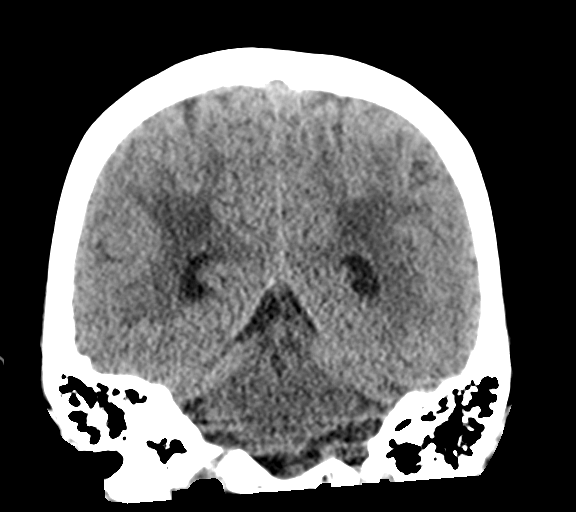
[im 29/64  brain]
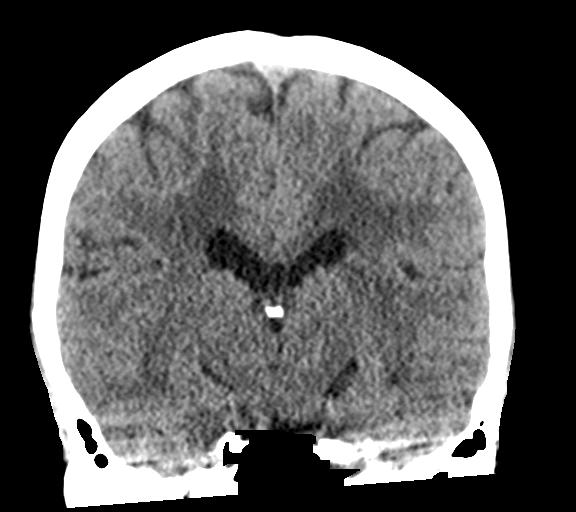
[im 36/64  brain]
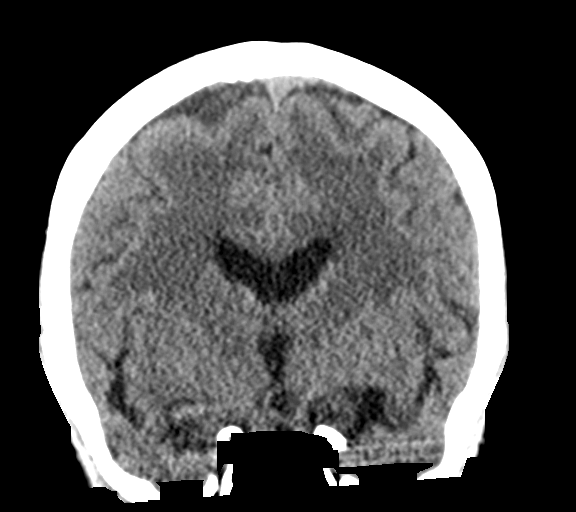

[Series 5: sagittal soft · sagittal · 0.28mm/px · 3 of 48 slices shown]
[im 16/48  brain]
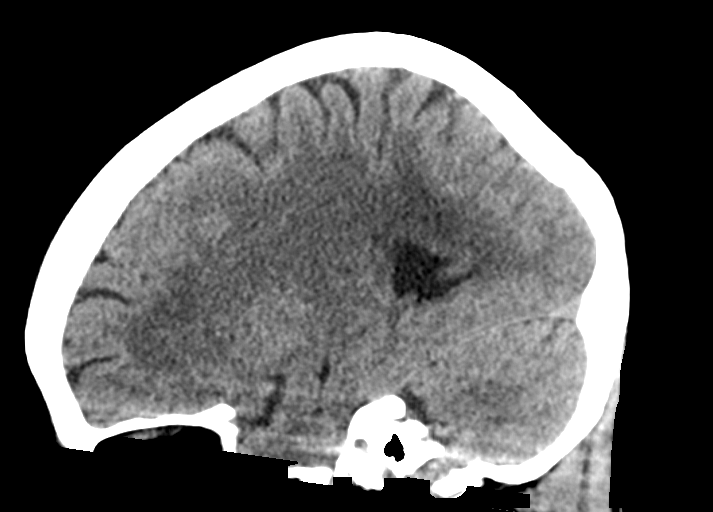
[im 24/48  brain]
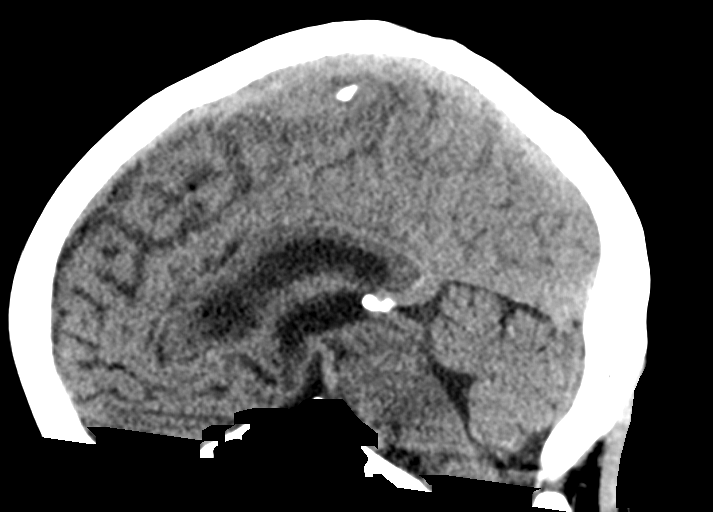
[im 32/48  brain]
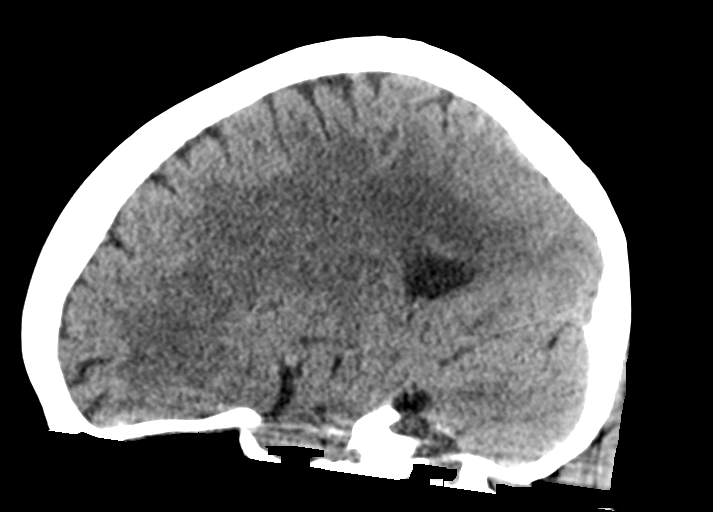

[14 of 46 positions shown; findings below may reference images not displayed]

FINDINGS: CT HEAD FINDINGS

Brain: No evidence of acute infarction, hemorrhage, hydrocephalus,
extra-axial collection or mass lesion/mass effect. Mild ischemic
changes are noted.

Vascular: No hyperdense vessel or unexpected calcification.

Skull: Normal. Negative for fracture or focal lesion.

Sinuses/Orbits: No acute finding.

Other: None.

CT CERVICAL SPINE FINDINGS

Alignment: Mild straightening of the normal cervical lordosis is
noted.

Skull base and vertebrae: 7 cervical segments are well visualized.
Vertebral body height is well maintained. Multilevel disc space
narrowing is noted from C3 to C7. Osteophytic changes and facet
hypertrophic changes are noted as well. No acute fracture or acute
facet abnormality is noted. The odontoid is within normal limits

Soft tissues and spinal canal: Surrounding soft tissue structures
show no focal hematoma. The thyroid is enlarged and diffusely
heterogeneous.

Upper chest: Visualized lung apices are within normal limits.

Other: None
IMPRESSION: CT of the head: Mild chronic ischemic changes are noted.

CT of the cervical spine: Multilevel degenerative changes seen.

Enlarged thyroid with diffuse heterogeneity. Recommend nonemergent
thyroid ultrasound (ref: [HOSPITAL]. [DATE]): 143-50).

## 2022-07-01 IMAGING — US US THYROID
1 series · 13 of 25 positions shown · non-contrast
Comparison: None.

CLINICAL DATA: Right thyroid nodule seen on CT

EXAM:
THYROID ULTRASOUND
TECHNIQUE: Ultrasound examination of the thyroid gland and adjacent soft
tissues was performed.

[Series 1: us thyroid · 0.06mm/px · 13 of 86 slices shown]
[im 1/86]
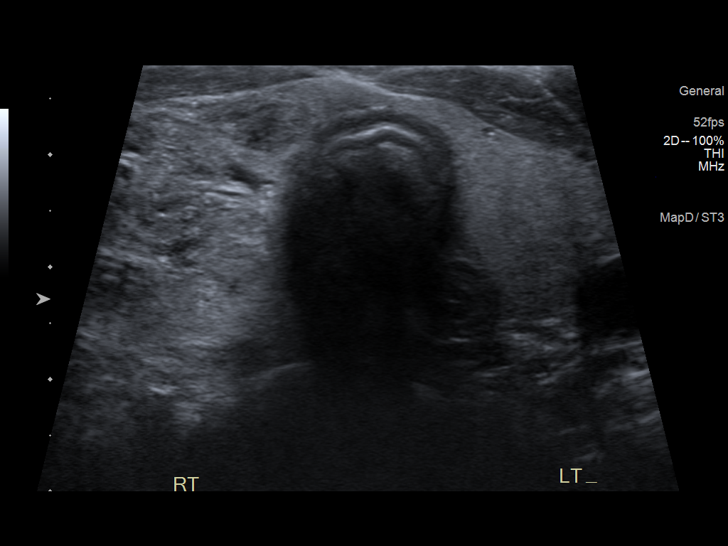
[im 8/86]
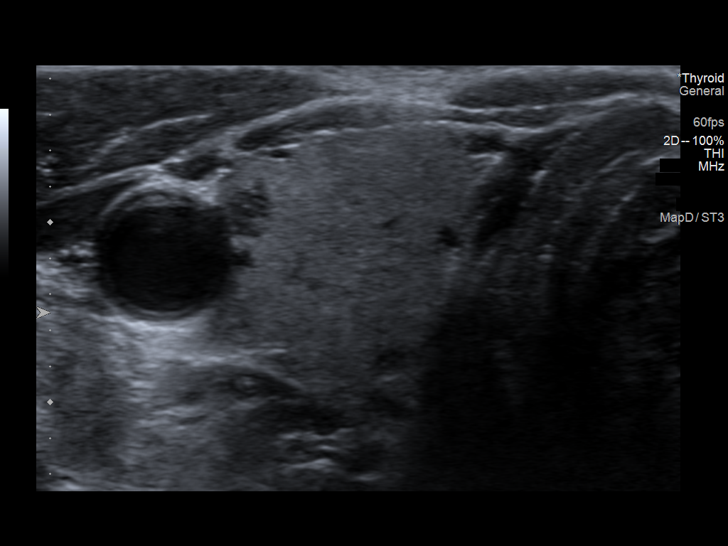
[im 15/86]
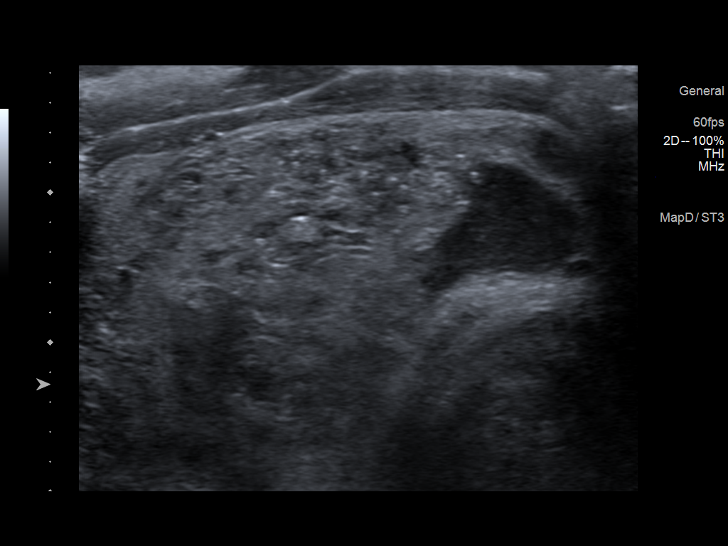
[im 22/86]
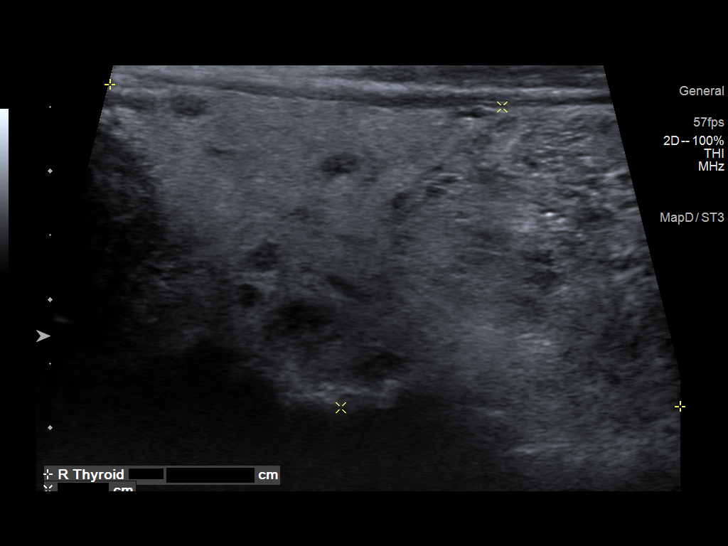
[im 29/86]
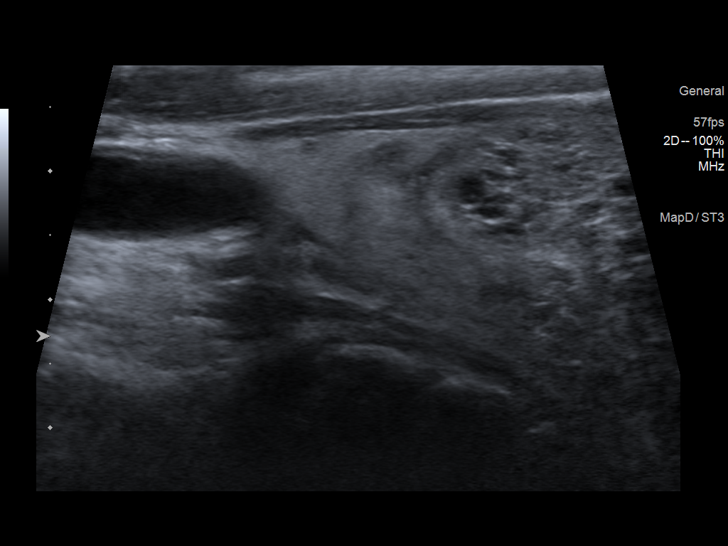
[im 36/86]
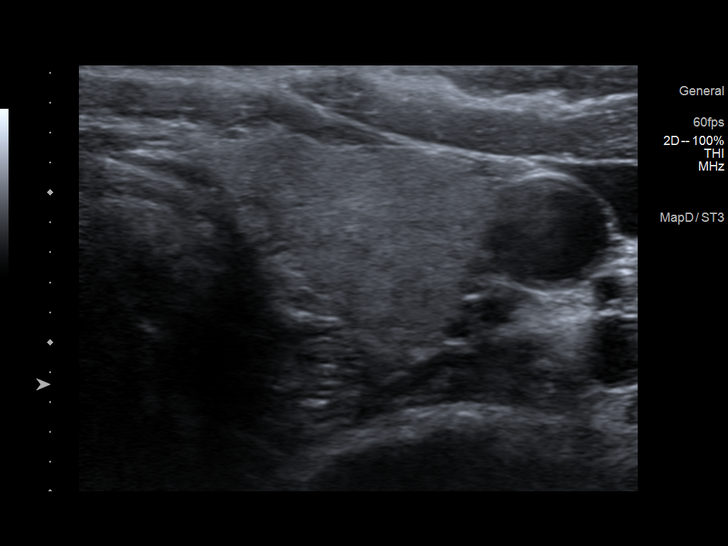
[im 43/86]
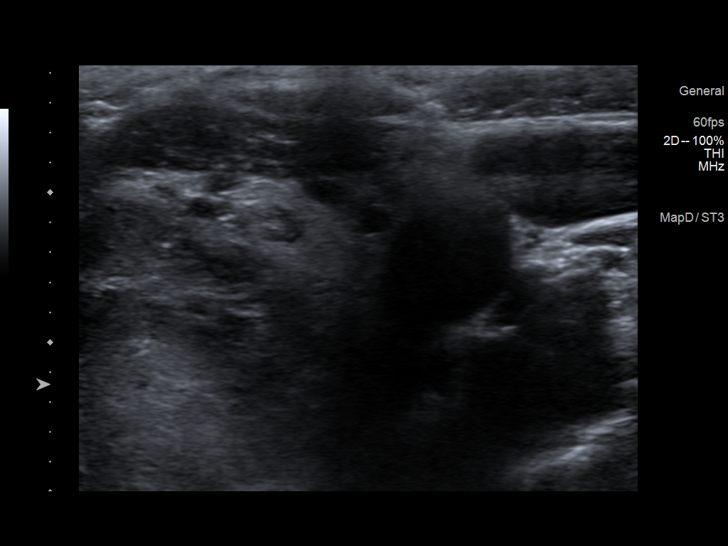
[im 50/86]
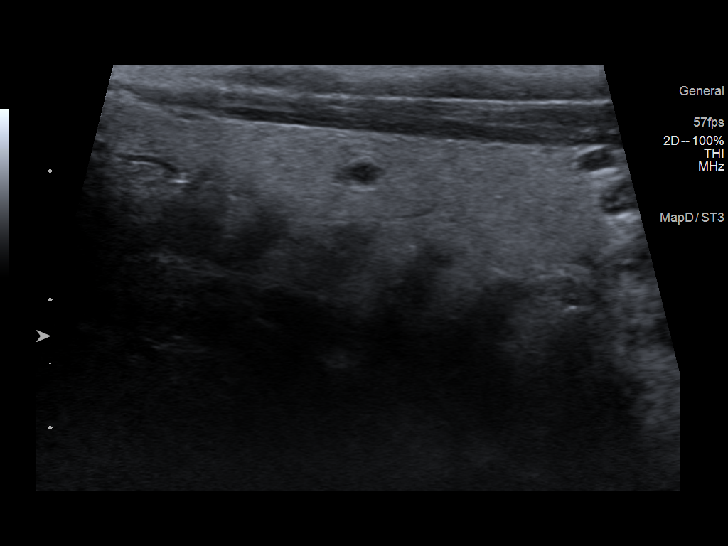
[im 57/86]
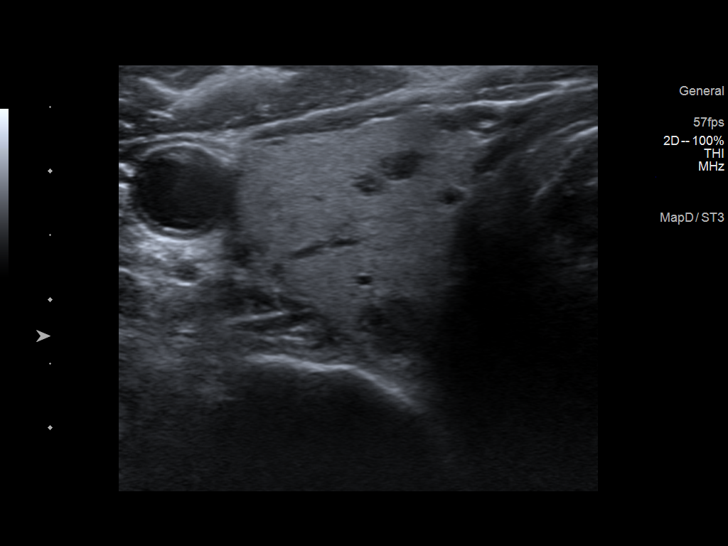
[im 64/86]
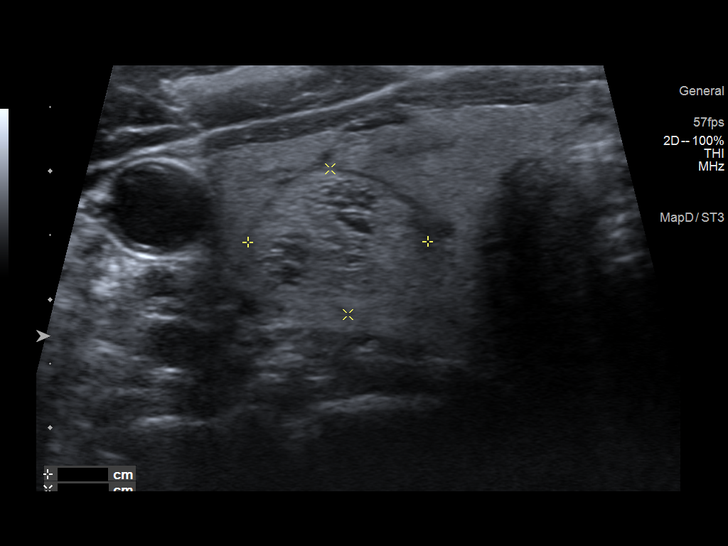
[im 71/86]
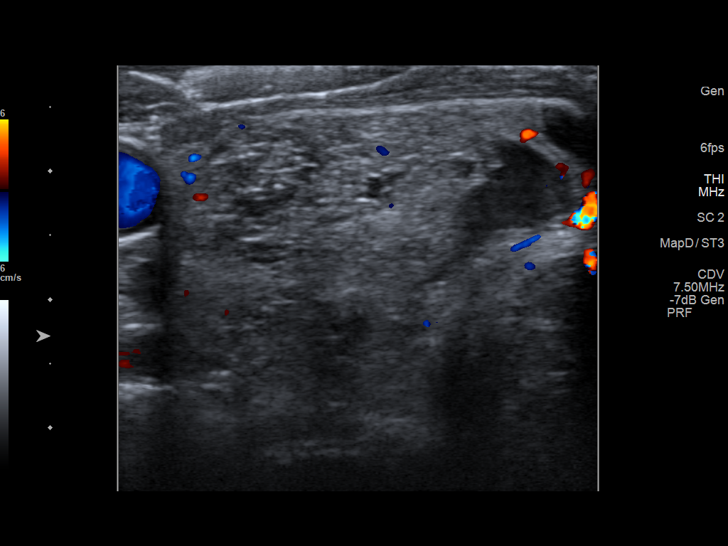
[im 78/86]
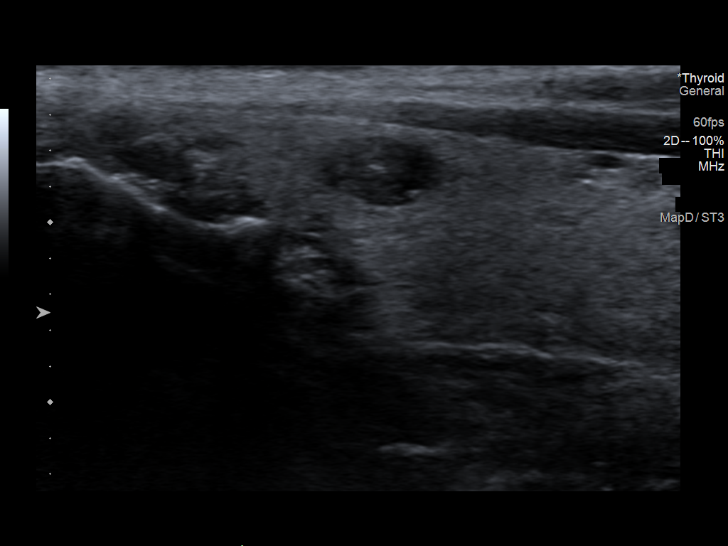
[im 86/86]
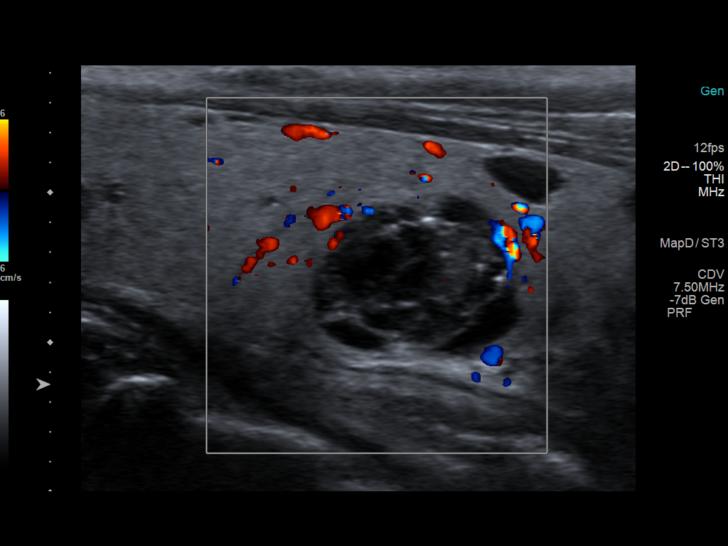

[13 of 25 positions shown; findings below may reference images not displayed]

FINDINGS: Parenchymal Echotexture: Mildly heterogeneous

Isthmus: 0.6 cm

Right lobe: 5.1 x 2.7 x 2.0 cm

Left lobe: 5.0 x 1.4 x 1.7 cm

_________________________________________________________

Estimated total number of nodules >/= 1 cm: 3

Number of spongiform nodules >/=  2 cm not described below (TR1): 0

Number of mixed cystic and solid nodules >/= 1.5 cm not described
below (TR2): 0

_________________________________________________________

Nodule 1: 0.7 cm right superior thyroid nodule does not meet
criteria for imaging surveillance or FNA.

_________________________________________________________

Nodule 2: 1.6 x 1.4 x 1.1 cm spongiform nodule in the mid right
thyroid lobe does not meet criteria for imaging surveillance or FNA.

_________________________________________________________

Nodule # 3:

Location: Right; inferior

Maximum size: 3.9 cm; Other 2 dimensions: 3.4 x 2.4 cm

Composition: solid/almost completely solid (2)

Echogenicity: isoechoic (1)

Shape: not taller-than-wide (0)

Margins: ill-defined (0)

Echogenic foci: none (0)

ACR TI-RADS total points: 3.

ACR TI-RADS risk category: TR3 (3 points).

ACR TI-RADS recommendations:

**Given size (>/= 2.5 cm) and appearance, fine needle aspiration of
this mildly suspicious nodule should be considered based on TI-RADS
criteria.

_________________________________________________________

Nodule 4: 0.8 cm predominantly cystic left superior thyroid nodule
does not meet criteria for imaging surveillance or FNA.

_________________________________________________________

Nodule 5: 1.5 x 1.1 x 1.0 cm cystic nodule with colloid does not
meet criteria for imaging surveillance or FNA.
IMPRESSION: Nodule 3 (TI-RADS 3), measuring 3.9 x 3.4 x 2.4 cm, located in the
inferior right thyroid lobe meets criteria for FNA.

The above is in keeping with the ACR TI-RADS recommendations - [HOSPITAL] 0797;[DATE].

## 2022-07-15 IMAGING — US US FNA BIOPSY THYROID 1ST LESION
1 series · 13 of 22 positions shown · non-contrast
Comparison: US Thyroid 09/15/21

MEDICATIONS:
8 cc 1% lidocaine

COMPLICATIONS:
None immediate.

INDICATION: Indeterminate thyroid nodule

Right inferior thyroid nodule
3.9 cm
EXAM:
ULTRASOUND GUIDED FINE NEEDLE ASPIRATION OF INDETERMINATE THYROID
NODULE
TECHNIQUE: Informed written consent was obtained from the patient after a
discussion of the risks, benefits and alternatives to treatment.
Questions regarding the procedure were encouraged and answered. A
timeout was performed prior to the initiation of the procedure.

[Series 1: us fna bx thyroid 1st lesion afirma · 22 acquisitions, 13 frames shown]
[im 1/22]
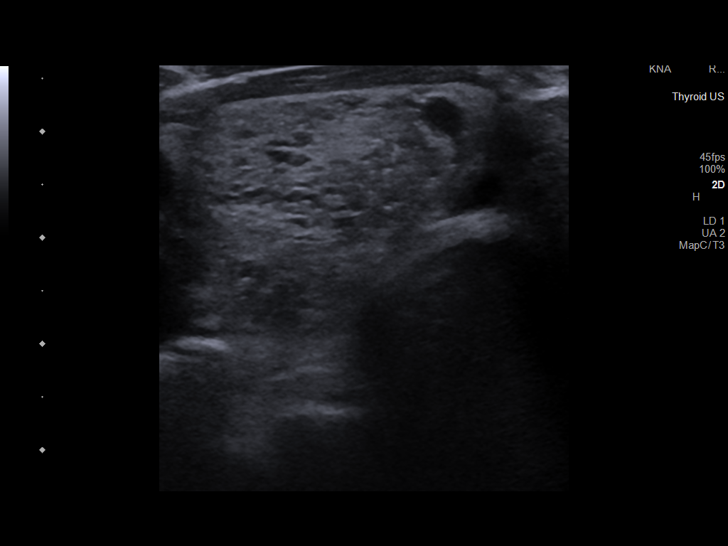
[im 3/22]
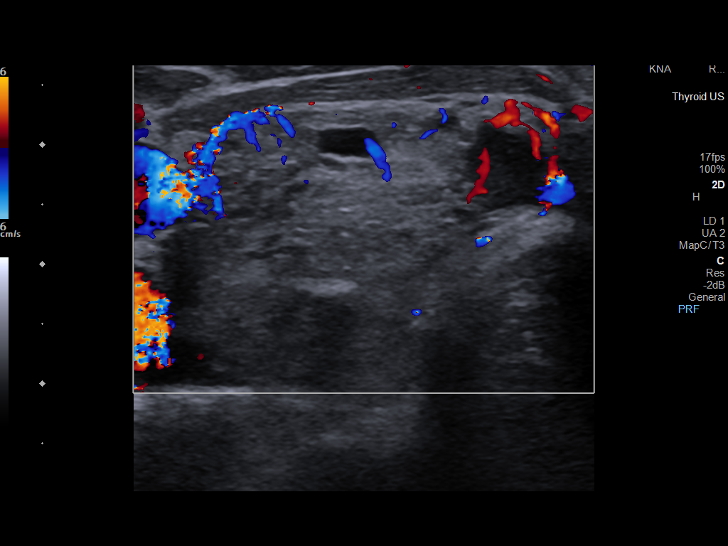
[im 5/22]
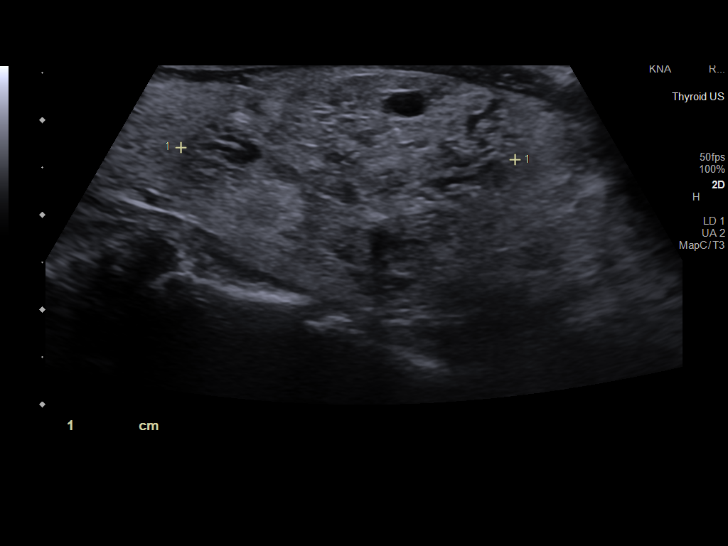
[im 6/22]
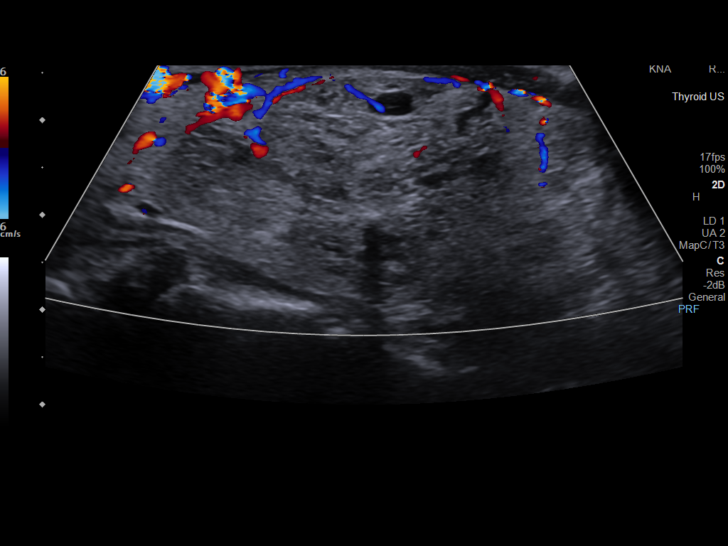
[im 8/22]
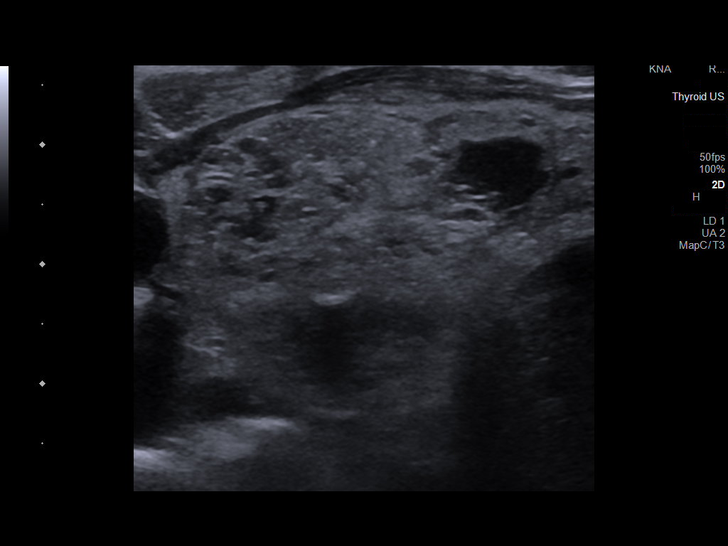
[im 10/22]
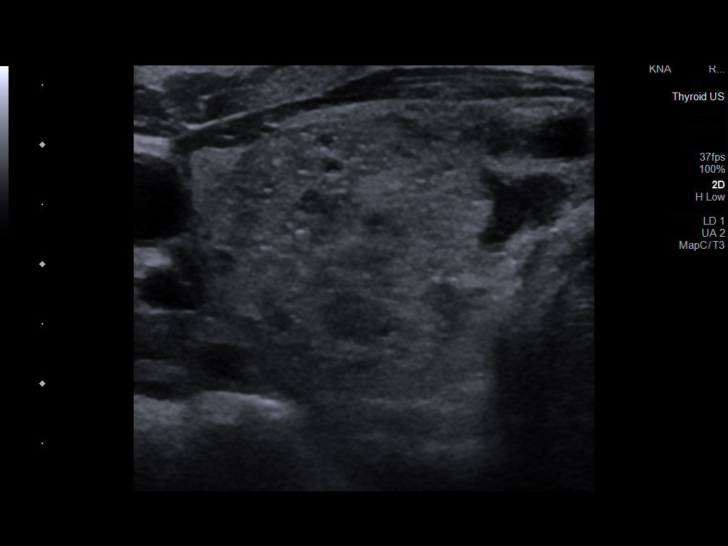
[im 12/22]
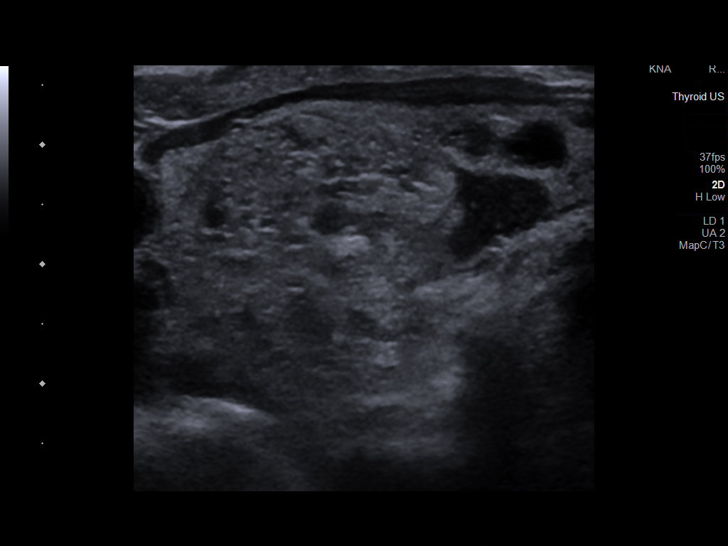
[im 13/22]
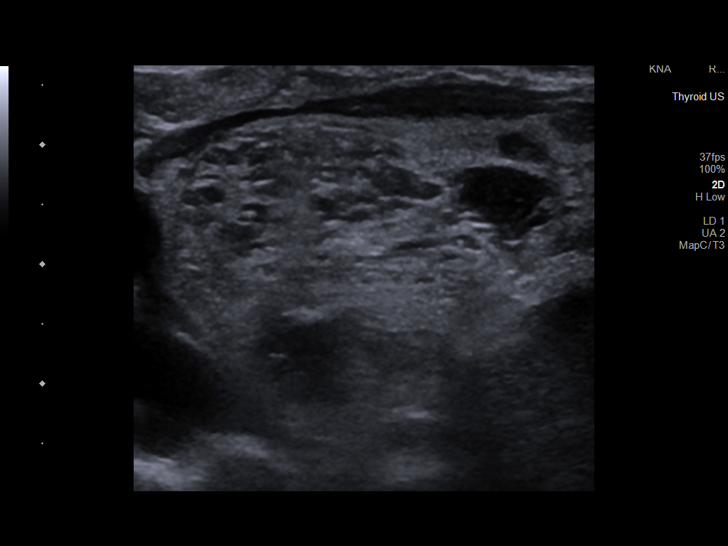
[im 15/22]
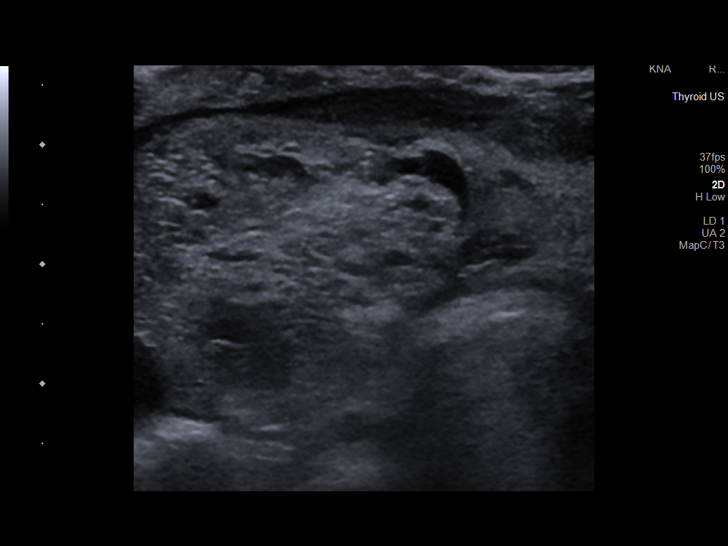
[im 17/22]
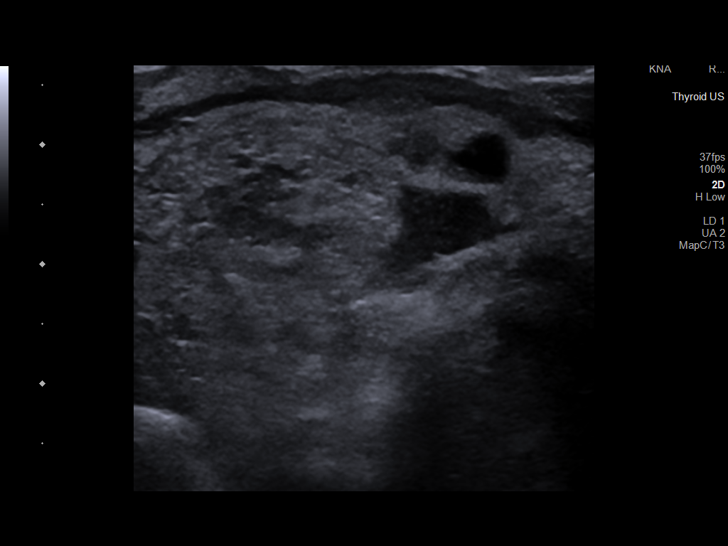
[im 18/22]
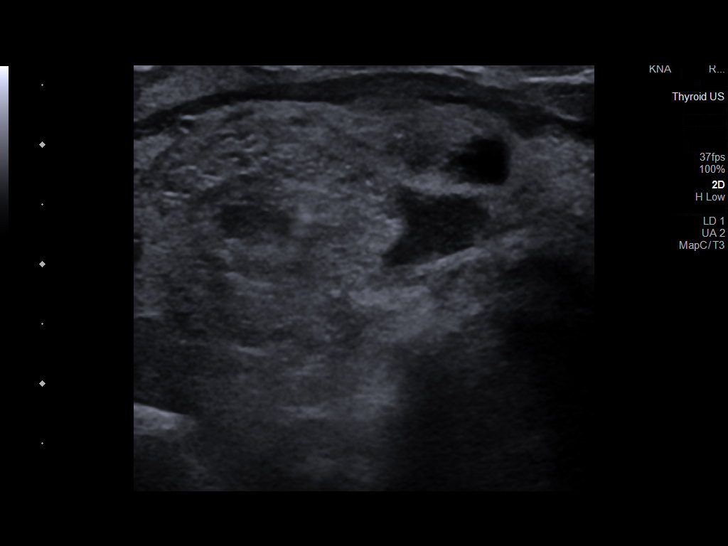
[im 20/22]
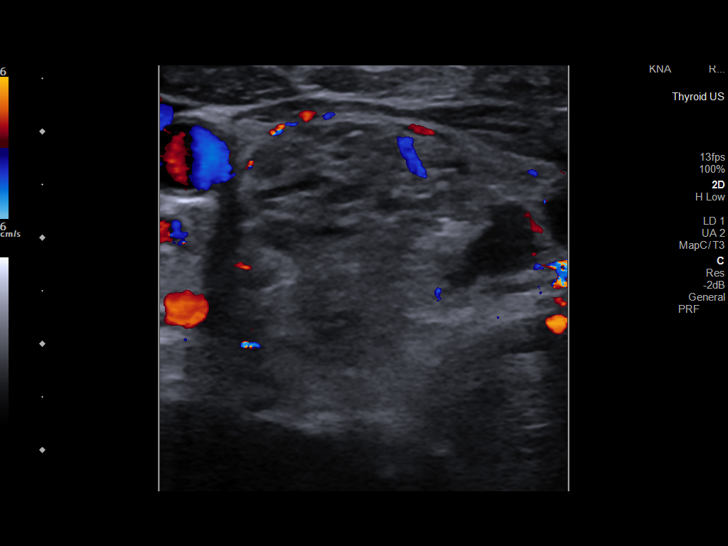
[im 22/22]
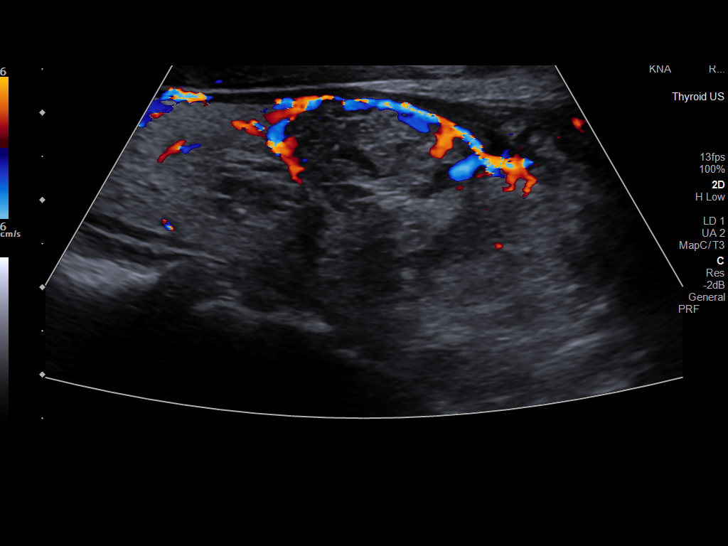

[13 of 22 positions shown; findings below may reference images not displayed]

Pre-procedural ultrasound scanning demonstrated unchanged size and
appearance of the indeterminate nodule within the right thyroid

The procedure was planned. The neck was prepped in the usual sterile
fashion, and a sterile drape was applied covering the operative
field. A timeout was performed prior to the initiation of the
procedure. Local anesthesia was provided with 1% lidocaine.

Under direct ultrasound guidance, 6 FNA biopsies were performed of
the right inferior thyroid nodule with a 25 gauge needle.

3 of these samples were obtained for AFIRMA

Multiple ultrasound images were saved for procedural documentation
purposes. The samples were prepared and submitted to pathology.

Limited post procedural scanning was negative for hematoma or
additional complication. Dressings were placed. The patient
tolerated the above procedures procedure well without immediate
postprocedural complication.
FINDINGS: Nodule reference number based on prior diagnostic ultrasound: 3

Maximum size: 3.9 cm

Location: Right; Inferior

ACR TI-RADS risk category: TR3 (3 points)

Reason for biopsy: meets ACR TI-RADS criteria

Ultrasound imaging confirms appropriate placement of the needles
within the thyroid nodule.
IMPRESSION: Technically successful ultrasound guided fine needle aspiration of
right inferior thyroid nodule

Read by Zirgu Bertasius

## 2022-10-18 ENCOUNTER — Ambulatory Visit (INDEPENDENT_AMBULATORY_CARE_PROVIDER_SITE_OTHER): Payer: Medicare PPO

## 2022-10-18 NOTE — Progress Notes (Signed)
Patient no longer at [facility]

## 2022-10-21 ENCOUNTER — Encounter: Payer: Self-pay | Admitting: Nurse Practitioner

## 2022-10-24 NOTE — Progress Notes (Unsigned)
Elizabeth Dickerson, female    DOB: 1951/01/27    MRN: [id number]   Brief patient profile:  33  yobf  active smoking  referred to pulmonary clinic in [place]  10/25/2022 by Elizabeth Dickerson  for f/u after    Admit date: 10/19/2022 Discharge date: 10/21/2022  Discharge Diagnoses:  Principal Problem: Left upper lobe pneumonia Active Problems: Tobacco dependence Hypertension GAD (generalized anxiety disorder) Severe protein-calorie malnutrition (CMS-HCC) COPD with acute exacerbation (CMS-HCC) Elevated brain natriuretic peptide (BNP) level Elevated troponin High serum lactate Tachycardia Atrial fibrillation with rapid ventricular response (CMS-HCC) Home Health/DME: [facility] Course:  Elizabeth Dickerson is a 72 y.o. female that presented to [facility] and was admitted for Left upper lobe pneumonia on 10/19/2022 1:54 AM .  Please refer to H&P for full details.  In summary, the patient has a past medical history significant for hypertension, COPD, anxiety and depression who presented to the emergency department on 10/19/2022 and was admitted with a diagnosis of left upper lobe pneumonia. Patient reported worsening shortness of breath that started the morning of the day of admission after waking up from sleep. She denied any known sick contacts, COVID workup was negative, patient does admit to smoking but admits that typically is 1 pack/day but most recently it has been 1 pack/week. She denied any fever, nausea, vomiting, diarrhea, change in appetite or fever. Patient's oxygen saturation was 93% on 2 L nasal cannula. Patient is non-O2 dependent at home.  Hospital course outlined below:  Probable/possible left upper lobe pneumonia Improved significantly Patient was treated with empiric IV antibiotics with Rocephin and doxycycline x 2 days and will be discharged with a prescription for doxycycline and Augmentin for an additional 7 days. Patient will also discharge home with  prednisone 40 mg daily x 5 days Patient was on supplemental oxygen at 2 L nasal cannula and weaned to room air. Patient underwent oxygen testing and did not meet requirements for supplemental oxygen at home. Afebrile since arrival  2. COPD with acute exacerbation Patient would benefit from outpatient referral to pulmonology which will be done at time of discharge Aggressive pulmonary hygiene during hospitalization with incentive spirometer and Acapella device  3. Tachycardia/suspected atrial fibrillation Patient does have elevated CHA2DS2-VASc score however there is no concrete evidence of atrial fibrillation, did not last for any discernible amount of time She was treated with 1 mg/kg Lovenox every 12 hours for stroke prophylaxis given possible A-fib initially however as she did not had any recurrence of this during hospital stay decrease Lovenox dose to DVT prophylaxis. Zio patch placement prior to discharge to assess cardiac rhythm with outpatient follow up with cardiology. [place] cardiology consultation and assistance with this case. Echo showed LVEF estimated at 65-70%  4. Primary hypertension BP improved  Continue home medications Was started on metoprolol and weaned off of Cardizem drip Will hold off on ACE inhibitor or ARB, defer to outpatient management  5. Right thyroid nodule TSH 0.099 Patient does have history of fine-needle aspiration showing benign nodule Results discussed with daughter Elizabeth Dickerson  Patient will need close outpatient follow up with PCP for further evaluation and management of TSH. It appears TSH was low in 6/23 with a normal T4  6. Severe protein calorie malnutrition Appreciate RD input Continue protein supplements at home Patient with obvious severe fat and muscle loss  Clinically, the patient has improved and is medically stable for discharge. The patient's discharge plan was discussed with Elizabeth Dickerson who is  in agreement and who also saw the patient  today.  alendronate 70 MG tablet Commonly known as: FOSAMAX START taking these medications   amoxicillin-clavulanate 875-125 mg per tablet Commonly known as: AUGMENTIN Take 1 tablet by mouth two (2) times a day for 7 days.  doxycycline 100 MG tablet Commonly known as: ADOXA Take 1 tablet (100 mg total) by mouth two (2) times a day for 7 days.  predniSONE 20 MG tablet Commonly known as: DELTASONE Take 2 tablets (40 mg total) by mouth daily for 5 days.     CHANGE how you take these medications   escitalopram oxalate 5 MG tablet Commonly known as: LEXAPRO Take 1 tablet (5 mg total) by mouth daily. What changed: additional instructions     CONTINUE taking these medications   albuterol 90 mcg/actuation inhaler Commonly known as: PROVENTIL HFA;VENTOLIN HFA Inhale 2 puffs every six (6) hours as needed for wheezing or shortness of breath.  amlodipine 10 MG tablet Commonly known as: NORVASC Take 1 tablet (10 mg total) by mouth daily.  megestrol 40 MG tablet Commonly known as: MEGACE Take 1 tablet (40 mg total) by mouth daily.  omeprazole 20 MG capsule Commonly known as: PriLOSEC Take 1 capsule (20 mg total) by mouth daily as needed.  tiotropium 18 mcg inhalation capsule Commonly known as: SPIRIVA WITH HANDIHALER Place 1 capsule (18 mcg total) into inhaler and inhale daily.    History of Present Illness  10/25/2022  Pulmonary/ 1st office eval/ Elizabeth Dickerson / Elizabeth Dickerson Office with grand daughter  Chief Complaint  Patient presents with   Consult    [facility] Hospitalization  SOB   Dyspnea:  50 ft  Cough: rattling / clear mucus Sleep: at 30 degrees > higher than baseline since d/c from [facility]  SABA use: not sure  02: none   No obvious day to day or daytime pattern/variability or assoc  purulent sputum or mucus plugs or hemoptysis or cp or chest tightness, subjective wheeze or overt sinus or hb symptoms.   Sleeping as above without nocturnal  or early am exacerbation  of  respiratory  c/o's or need for noct saba. Also denies any obvious fluctuation of symptoms with weather or environmental changes or other aggravating or alleviating factors except as outlined above   No unusual exposure hx or h/o childhood pna/ asthma or knowledge of premature birth.  Current Allergies, Complete Past Medical History, Past Surgical History, Family History, and Social History were reviewed in [employer] record.  ROS  The following are not active complaints unless bolded Hoarseness, sore throat, dysphagia, dental problems, itching, sneezing,  nasal congestion or discharge of excess mucus or purulent secretions, ear ache,   fever, chills, sweats, unintended wt loss or wt gain, classically pleuritic or exertional cp,  orthopnea pnd or arm/hand swelling  or leg swelling, presyncope, palpitations, abdominal pain, anorexia, nausea, vomiting, diarrhea  or change in bowel habits or change in bladder habits, change in stools or change in urine, dysuria, hematuria,  rash, arthralgias, visual complaints, headache, numbness, weakness or ataxia or problems with walking or coordination,  change in mood or  memory.             Past Medical History:  Diagnosis Date   Anxiety    panic attack    Hypertension     Outpatient Medications Prior to Visit  Medication Sig Dispense Refill   albuterol (VENTOLIN HFA) 108 (90 Base) MCG/ACT inhaler Inhale 2 puffs into the lungs every 6 (six)  hours as needed for wheezing or shortness of breath. 8 g 0   amLODipine (NORVASC) 5 MG tablet Take 1 tablet (5 mg total) by mouth daily. 90 tablet 3   megestrol (MEGACE) 40 MG tablet Take 1 tablet by mouth daily.     UNABLE TO FIND Med Name: Metoprolol PO unsure of dosage     predniSONE (DELTASONE) 20 MG tablet 2 po at sametime daily for 5 days- start tomorrow 10 tablet 0   No facility-administered medications prior to visit.     Objective:     BP 132/82   Pulse 92   Temp 97.6 F (36.4  C)   Ht '5\' 7"'$  (1.702 m)   Wt 89 lb 12.8 oz (40.7 kg)   SpO2 96% Comment: room air- directly after walk from lobby to room  BMI 14.06 kg/m   SpO2: 96 % (room air- directly after walk from lobby to room)  Amb somber  bf increased wob at rest/ walks with walker   HEENT :  Oropharynx  clear      NECK :  without JVD/Nodes/TM/ nl carotid upstrokes bilaterally   LUNGS: no acc muscle use,  Mod barrel  contour chest wall with bilateral  Distant bs s audible wheeze and  without cough on insp or exp maneuvers and mod  Hyperresonant  to  percussion bilaterally     CV:  RRR  no s3 or murmur or increase in P2, and no edema   ABD:  soft and nontender with pos mid insp Hoover's  in the supine position. No bruits or organomegaly appreciated, bowel sounds nl  MS:   Ext warm without deformities or   obvious joint restrictions , calf tenderness, cyanosis or clubbing  SKIN: warm and dry without lesions    NEURO:  alert, approp, nl sensorium with  no motor or cerebellar deficits apparent.             Assessment   DOE (dyspnea on exertion) Onset around winter of 2023 - 10/25/2022 1st pulmonary eval with wt loss/sob at rest p admit [facility] 10/19/22 with possible LUL pna and low TSH/ Afib with Rapid response > placed on bisoprolol and d/c'd amlodipine and checked thyroid panel  In addition to copd, I suspect she is is hyperthyroid so made the above adjustments pending results and f/u in 4 weeks with referral to endocrine in the meantime if needed   Cigarette smoker Counseled re importance of smoking cessation but did not meet time criteria for separate billing      Hyperthyroidism with palpable thyroid nodule IR bx  09/29/21 on R : benign follicular nodule  - TSH  0.099 10/19/22 - full thyroid panel 10/25/2022 >>>   Rec full thyroid profile/ start Beta blockers -  In the setting of active respiratory symptoms  it's preferable to use  bisoprolol the most selective generic choice  on the market,  at least on a trial basis, to make sure the spillover Beta 2 effects of the less specific Beta blockers are not contributing to this patient's symptoms.   Rx bisoprolol and endocrine referral pending the labs done today   COPD GOLD ? Says quit smoking on admit 10/19/22  - 10/25/2022  After extensive coaching inhaler device,  effectiveness =    75% with elipta > trelegy trial    Group D (now reclassified as E) in terms of symptom/risk and laba/lama/ICS  therefore appropriate rx at this point >>>  trelegy and approp saba plus  Prednisone 10 mg take  4 each am x 2 days,   2 each am x 2 days,  1 each am x 2 days and stop   F/u in 4 weeks, call sooner if needed         Each maintenance medication was reviewed in detail including emphasizing most importantly the difference between maintenance and prns and under what circumstances the prns are to be triggered using an action plan format where appropriate.  Total time for H and P, chart review, counseling, reviewing hfa/dpi device(s) and generating customized AVS unique to this office visit / same day charting  > 45 min with pt new to me            Christinia Gully, MD 10/25/2022

## 2022-10-25 ENCOUNTER — Encounter: Payer: Self-pay | Admitting: Internal Medicine

## 2022-10-25 ENCOUNTER — Ambulatory Visit (INDEPENDENT_AMBULATORY_CARE_PROVIDER_SITE_OTHER): Payer: Medicare PPO | Admitting: Internal Medicine

## 2022-10-25 VITALS — BP 132/82 | HR 92 | Temp 97.6°F | Ht 67.0 in | Wt 89.8 lb

## 2022-10-25 DIAGNOSIS — F17210 Nicotine dependence, cigarettes, uncomplicated: Secondary | ICD-10-CM

## 2022-10-25 DIAGNOSIS — E0520 Thyrotoxicosis with toxic multinodular goiter without thyrotoxic crisis or storm: Secondary | ICD-10-CM

## 2022-10-25 DIAGNOSIS — R0609 Other forms of dyspnea: Secondary | ICD-10-CM

## 2022-10-25 DIAGNOSIS — J449 Chronic obstructive pulmonary disease, unspecified: Secondary | ICD-10-CM

## 2022-10-25 MED ORDER — PREDNISONE 10 MG PO TABS: ORAL_TABLET | ORAL | 0 refills | Status: DC

## 2022-10-25 MED ORDER — TRELEGY ELLIPTA 100-62.5-25 MCG/ACT IN AEPB: INHALATION_SPRAY | RESPIRATORY_TRACT | Status: AC

## 2022-10-25 MED ORDER — BISOPROLOL FUMARATE 5 MG PO TABS: 5.0000 mg | ORAL_TABLET | Freq: Every day | ORAL | 11 refills | Status: AC

## 2022-10-25 NOTE — Assessment & Plan Note (Signed)
Counseled re importance of smoking cessation but did not meet time criteria for separate billing   °

## 2022-10-25 NOTE — Assessment & Plan Note (Addendum)
Onset around winter of 2023 - 10/25/2022 1st pulmonary eval with wt loss/sob at rest p admit [facility] 10/19/22 with possible LUL pna and low TSH/ Afib with Rapid response > placed on bisoprolol and d/c'd amlodipine and checked thyroid panel  In addition to copd, I suspect she is is hyperthyroid so made the above adjustments pending results and f/u in 4 weeks with referral to endocrine in the meantime if needed

## 2022-10-25 NOTE — Patient Instructions (Addendum)
Prednisone 10 mg take  4 each am x 2 days,   2 each am x 2 days,  1 each am x 2 days and stop   For cough > mucinex 1200 mg every 12 hours as needed   Plan A = Automatic = Always=   Trelegy 248 one click each am (stop Spiriva)  Rinse and gargle and brush teeth with arm and hammer tootpaste after use   Plan B = Backup (to supplement plan A, not to replace it) Only use your albuterol inhaler as a rescue medication to be used if you can't catch your breath by resting or doing a relaxed purse lip breathing pattern.  - The less you use it, the better it will work when you need it. - Ok to use the inhaler up to 2 puffs  every 4 hours if you must but call for appointment if use goes up over your usual need - Don't leave home without it !!  (think of it like the spare tire for your car)    Please remember to go to the lab department   for your tests - we will call you with the results when they are available.      Please schedule a follow up office visit in 6 weeks, call sooner if needed with all medications /inhalers/ solutions in hand so we can verify exactly what you are taking. This includes all medications from all doctors and over the [place] separate them into three  bags:  the ones you take automatically, no matter what, vs the ones you take just when you feel you need them "BAG #2 is UP TO YOU"  - this will really help [place] help you take your medications more effectively. -  also a bag  of medications you don't plan to take in the future.

## 2022-10-25 NOTE — Assessment & Plan Note (Signed)
Says quit smoking on admit 10/19/22  - 10/25/2022  After extensive coaching inhaler device,  effectiveness =    75% with elipta > trelegy trial    Group D (now reclassified as E) in terms of symptom/risk and laba/lama/ICS  therefore appropriate rx at this point >>>  trelegy and approp saba plus Prednisone 10 mg take  4 each am x 2 days,   2 each am x 2 days,  1 each am x 2 days and stop   F/u in 4 weeks, call sooner if needed         Each maintenance medication was reviewed in detail including emphasizing most importantly the difference between maintenance and prns and under what circumstances the prns are to be triggered using an action plan format where appropriate.  Total time for H and P, chart review, counseling, reviewing hfa/dpi device(s) and generating customized AVS unique to this office visit / same day charting  > 45 min with pt new to me

## 2022-10-25 NOTE — Assessment & Plan Note (Signed)
IR bx  09/29/21 on R : benign follicular nodule  - TSH  0.099 10/19/22 - full thyroid panel 10/25/2022 >>>   Rec full thyroid profile/ start Beta blockers -  In the setting of active respiratory symptoms  it's preferable to use  bisoprolol the most selective generic choice  on the market, at least on a trial basis, to make sure the spillover Beta 2 effects of the less specific Beta blockers are not contributing to this patient's symptoms.   Rx bisoprolol and endocrine referral pending the labs done today

## 2022-10-26 LAB — CBC WITH DIFFERENTIAL/PLATELET
Basophils Absolute: 0 10*3/uL (ref 0.0–0.2)
Basos: 0 %
EOS (ABSOLUTE): 0 10*3/uL (ref 0.0–0.4)
Eos: 0 %
Hematocrit: 41.8 % (ref 34.0–46.6)
Hemoglobin: 13.9 g/dL (ref 11.1–15.9)
Immature Grans (Abs): 0.1 10*3/uL (ref 0.0–0.1)
Immature Granulocytes: 1 %
Lymphocytes Absolute: 1.1 10*3/uL (ref 0.7–3.1)
Lymphs: 16 %
MCH: 26.4 pg — ABNORMAL LOW (ref 26.6–33.0)
MCHC: 33.3 g/dL (ref 31.5–35.7)
MCV: 80 fL (ref 79–97)
Monocytes Absolute: 0.4 10*3/uL (ref 0.1–0.9)
Monocytes: 5 %
Neutrophils Absolute: 5 10*3/uL (ref 1.4–7.0)
Neutrophils: 78 %
Platelets: 218 10*3/uL (ref 150–450)
RBC: 5.26 x10E6/uL (ref 3.77–5.28)
RDW: 14.2 % (ref 11.7–15.4)
WBC: 6.5 10*3/uL (ref 3.4–10.8)

## 2022-10-26 LAB — BASIC METABOLIC PANEL
BUN/Creatinine Ratio: 15 (ref 12–28)
BUN: 14 mg/dL (ref 8–27)
CO2: 20 mmol/L (ref 20–29)
Calcium: 9.3 mg/dL (ref 8.7–10.3)
Chloride: 102 mmol/L (ref 96–106)
Creatinine, Ser: 0.91 mg/dL (ref 0.57–1.00)
Glucose: 123 mg/dL — ABNORMAL HIGH (ref 70–99)
Potassium: 2.7 mmol/L — ABNORMAL LOW (ref 3.5–5.2)
Sodium: 142 mmol/L (ref 134–144)
eGFR: 68 mL/min/{1.73_m2} (ref >59)

## 2022-10-26 LAB — T3 UPTAKE
Free Thyroxine Index: 2.7 (ref 1.2–4.9)
T3 Uptake Ratio: 33 % (ref 24–39)

## 2022-10-26 LAB — T4: T4, Total: 8.3 ug/dL (ref 4.5–12.0)

## 2022-10-26 LAB — SEDIMENTATION RATE: Sed Rate: 29 mm/hr (ref 0–40)

## 2022-10-26 LAB — BRAIN NATRIURETIC PEPTIDE: BNP: 22.3 pg/mL (ref 0.0–100.0)

## 2022-10-26 LAB — T3, FREE: T3, Free: 1.9 pg/mL — ABNORMAL LOW (ref 2.0–4.4)

## 2022-10-31 ENCOUNTER — Other Ambulatory Visit: Payer: Self-pay

## 2022-10-31 DIAGNOSIS — E876 Hypokalemia: Secondary | ICD-10-CM

## 2022-10-31 MED ORDER — POTASSIUM CHLORIDE CRYS ER 20 MEQ PO TBCR: EXTENDED_RELEASE_TABLET | ORAL | 0 refills | Status: DC

## 2022-11-01 ENCOUNTER — Telehealth: Payer: Self-pay | Admitting: Internal Medicine

## 2022-11-01 ENCOUNTER — Inpatient Hospital Stay: Payer: Medicare PPO | Admitting: Acute Care

## 2022-11-01 MED ORDER — POTASSIUM CHLORIDE CRYS ER 20 MEQ PO TBCR: EXTENDED_RELEASE_TABLET | ORAL | 0 refills | Status: DC

## 2022-11-01 NOTE — Telephone Encounter (Signed)
ATC patients grand daughter back    Tanda Rockers, MD 10/27/2022  8:27 AM EST     Call patient :  Studies are unremarkable except for low potassium Needs to eat more fruits and vegetables - dried fruit and bananas are best sources   Would add Kdur 20 meq take two daily x 5 days then one daily and recheck bmet in 2 weeks

## 2022-11-01 NOTE — Addendum Note (Signed)
Addended by: Fritzi Mandes D on: 11/01/2022 08:14 AM   Modules accepted: Orders

## 2022-11-02 ENCOUNTER — Other Ambulatory Visit: Payer: Self-pay

## 2022-11-02 MED ORDER — POTASSIUM CHLORIDE CRYS ER 20 MEQ PO TBCR: EXTENDED_RELEASE_TABLET | ORAL | 0 refills | Status: DC

## 2022-11-02 NOTE — Telephone Encounter (Signed)
Called and spoke with patients granddaughter Huey Romans.  Gave her results per MW and she also asked about TSH levels. Gave her T3 and T4 levels and notified her they were both within normal range.  She states patient started [facility] yesterday and plan to get labs rechecked 2 weeks from now.  Nothing further needed

## 2022-12-07 ENCOUNTER — Ambulatory Visit: Payer: Medicare PPO | Admitting: Internal Medicine

## 2022-12-07 NOTE — Progress Notes (Deleted)
Elizabeth Dickerson, female    DOB: 1951/03/09    MRN: [id number]   Brief patient profile:  61 yobf  active smoking  referred to pulmonary clinic in [place]  10/25/2022 by Georgina Pillion  for f/u after    Admit date: 10/19/2022 Discharge date: 10/21/2022  Discharge Diagnoses:  Principal Problem: Left upper lobe pneumonia Active Problems: Tobacco dependence Hypertension GAD (generalized anxiety disorder) Severe protein-calorie malnutrition (CMS-HCC) COPD with acute exacerbation (CMS-HCC) Elevated brain natriuretic peptide (BNP) level Elevated troponin High serum lactate Tachycardia Atrial fibrillation with rapid ventricular response (CMS-HCC) Home Health/DME: [facility] Course:  Elizabeth Dickerson is a 72 y.o. female that presented to [facility] and was admitted for Left upper lobe pneumonia on 10/19/2022 1:54 AM .  Please refer to H&P for full details.  In summary, the patient has a past medical history significant for hypertension, COPD, anxiety and depression who presented to the emergency department on 10/19/2022 and was admitted with a diagnosis of left upper lobe pneumonia. Patient reported worsening shortness of breath that started the morning of the day of admission after waking up from sleep. She denied any known sick contacts, COVID workup was negative, patient does admit to smoking but admits that typically is 1 pack/day but most recently it has been 1 pack/week. She denied any fever, nausea, vomiting, diarrhea, change in appetite or fever. Patient's oxygen saturation was 93% on 2 L nasal cannula. Patient is non-O2 dependent at home.  Hospital course outlined below:  Probable/possible left upper lobe pneumonia Improved significantly Patient was treated with empiric IV antibiotics with Rocephin and doxycycline x 2 days and will be discharged with a prescription for doxycycline and Augmentin for an additional 7 days. Patient will also discharge home with  prednisone 40 mg daily x 5 days Patient was on supplemental oxygen at 2 L nasal cannula and weaned to room air. Patient underwent oxygen testing and did not meet requirements for supplemental oxygen at home. Afebrile since arrival  2. COPD with acute exacerbation Patient would benefit from outpatient referral to pulmonology which will be done at time of discharge Aggressive pulmonary hygiene during hospitalization with incentive spirometer and Acapella device  3. Tachycardia/suspected atrial fibrillation Patient does have elevated CHA2DS2-VASc score however there is no concrete evidence of atrial fibrillation, did not last for any discernible amount of time She was treated with 1 mg/kg Lovenox every 12 hours for stroke prophylaxis given possible A-fib initially however as she did not had any recurrence of this during hospital stay decrease Lovenox dose to DVT prophylaxis. Zio patch placement prior to discharge to assess cardiac rhythm with outpatient follow up with cardiology. [place] cardiology consultation and assistance with this case. Echo showed LVEF estimated at 65-70%  4. Primary hypertension BP improved  Continue home medications Was started on metoprolol and weaned off of Cardizem drip Will hold off on ACE inhibitor or ARB, defer to outpatient management  5. Right thyroid nodule TSH 0.099 Patient does have history of fine-needle aspiration showing benign nodule Results discussed with daughter Vita Barley  Patient will need close outpatient follow up with PCP for further evaluation and management of TSH. It appears TSH was low in 6/23 with a normal T4  6. Severe protein calorie malnutrition Appreciate RD input Continue protein supplements at home Patient with obvious severe fat and muscle loss  Clinically, the patient has improved and is medically stable for discharge. The patient's discharge plan was discussed with Dr. Patrecia Pace who is in  agreement and who also saw the patient  today.  alendronate 70 MG tablet Commonly known as: FOSAMAX START taking these medications   amoxicillin-clavulanate 875-125 mg per tablet Commonly known as: AUGMENTIN Take 1 tablet by mouth two (2) times a day for 7 days.  doxycycline 100 MG tablet Commonly known as: ADOXA Take 1 tablet (100 mg total) by mouth two (2) times a day for 7 days.  predniSONE 20 MG tablet Commonly known as: DELTASONE Take 2 tablets (40 mg total) by mouth daily for 5 days.     CHANGE how you take these medications   escitalopram oxalate 5 MG tablet Commonly known as: LEXAPRO Take 1 tablet (5 mg total) by mouth daily. What changed: additional instructions     CONTINUE taking these medications   albuterol 90 mcg/actuation inhaler Commonly known as: PROVENTIL HFA;VENTOLIN HFA Inhale 2 puffs every six (6) hours as needed for wheezing or shortness of breath.  amlodipine 10 MG tablet Commonly known as: NORVASC Take 1 tablet (10 mg total) by mouth daily.  megestrol 40 MG tablet Commonly known as: MEGACE Take 1 tablet (40 mg total) by mouth daily.  omeprazole 20 MG capsule Commonly known as: PriLOSEC Take 1 capsule (20 mg total) by mouth daily as needed.  tiotropium 18 mcg inhalation capsule Commonly known as: SPIRIVA WITH HANDIHALER Place 1 capsule (18 mcg total) into inhaler and inhale daily.    History of Present Illness  10/25/2022  Pulmonary/ 1st office eval/ Elizabeth Dickerson / [facility] Office with grand daughter  Chief Complaint  Patient presents with   Consult    [facility] Hospitalization  SOB   Dyspnea:  50 ft  Cough: rattling / clear mucus Sleep: at 30 degrees > higher than baseline since d/c from [facility]  SABA use: not sure  02: none  Rec Prednisone 10 mg take  4 each am x 2 days,   2 each am x 2 days,  1 each am x 2 days and stop  For cough > mucinex 1200 mg every 12 hours as needed  Plan A = Automatic = Always=   Trelegy [id number] one click each am (stop Spiriva) Rinse and gargle and  brush teeth with arm and hammer tootpaste after use  Plan B = Backup (to supplement plan A, not to replace it) Only use your albuterol inhaler as a rescue medication  Please schedule a follow up office visit in 6 weeks, call sooner if needed with all medications /inhalers/ solutions in hand    12/07/2022  f/u ov/[place] office/Elizabeth Dickerson re: DOE maint on ***  No chief complaint on file.   Dyspnea:  *** Cough: *** Sleeping: *** SABA use: *** 02: *** Covid status: *** Lung cancer screening: ***   No obvious day to day or daytime variability or assoc excess/ purulent sputum or mucus plugs or hemoptysis or cp or chest tightness, subjective wheeze or overt sinus or hb symptoms.   *** without nocturnal  or early am exacerbation  of respiratory  c/o's or need for noct saba. Also denies any obvious fluctuation of symptoms with weather or environmental changes or other aggravating or alleviating factors except as outlined above   No unusual exposure hx or h/o childhood pna/ asthma or knowledge of premature birth.  Current Allergies, Complete Past Medical History, Past Surgical History, Family History, and Social History were reviewed in [employer] record.  ROS  The following are not active complaints unless bolded Hoarseness, sore throat, dysphagia, dental problems, itching, sneezing,  nasal congestion or discharge of excess mucus or purulent secretions, ear ache,   fever, chills, sweats, unintended wt loss or wt gain, classically pleuritic or exertional cp,  orthopnea pnd or arm/hand swelling  or leg swelling, presyncope, palpitations, abdominal pain, anorexia, nausea, vomiting, diarrhea  or change in bowel habits or change in bladder habits, change in stools or change in urine, dysuria, hematuria,  rash, arthralgias, visual complaints, headache, numbness, weakness or ataxia or problems with walking or coordination,  change in mood or  memory.        No outpatient  medications have been marked as taking for the 12/07/22 encounter (Appointment) with Tanda Rockers, MD.               Past Medical History:  Diagnosis Date   Anxiety    panic attack    Hypertension        Objective:    Wt Readings from Last 3 Encounters:  10/25/22 89 lb 12.8 oz (40.7 kg)  10/15/21 95 lb (43.1 kg)  09/07/21 83 lb (37.6 kg)      Vital signs reviewed  12/07/2022  - Note at rest 02 sats  ***% on ***   General appearance:    ***       Assessment

## 2022-12-13 ENCOUNTER — Encounter: Payer: Self-pay | Admitting: "Endocrinology

## 2023-01-04 ENCOUNTER — Ambulatory Visit: Payer: Medicare PPO | Admitting: "Endocrinology

## 2023-01-30 DIAGNOSIS — Z7951 Long term (current) use of inhaled steroids: Secondary | ICD-10-CM | POA: Diagnosis not present

## 2023-01-30 DIAGNOSIS — J449 Chronic obstructive pulmonary disease, unspecified: Secondary | ICD-10-CM | POA: Diagnosis not present

## 2023-01-30 DIAGNOSIS — Z681 Body mass index (BMI) 19 or less, adult: Secondary | ICD-10-CM | POA: Diagnosis not present

## 2023-01-30 DIAGNOSIS — Z008 Encounter for other general examination: Secondary | ICD-10-CM | POA: Diagnosis not present

## 2023-01-30 DIAGNOSIS — Z8249 Family history of ischemic heart disease and other diseases of the circulatory system: Secondary | ICD-10-CM | POA: Diagnosis not present

## 2023-01-30 DIAGNOSIS — Z803 Family history of malignant neoplasm of breast: Secondary | ICD-10-CM | POA: Diagnosis not present

## 2023-01-30 DIAGNOSIS — F419 Anxiety disorder, unspecified: Secondary | ICD-10-CM | POA: Diagnosis not present

## 2023-01-30 DIAGNOSIS — R636 Underweight: Secondary | ICD-10-CM | POA: Diagnosis not present

## 2023-01-30 DIAGNOSIS — Z720 Tobacco use: Secondary | ICD-10-CM | POA: Diagnosis not present

## 2023-01-30 DIAGNOSIS — I10 Essential (primary) hypertension: Secondary | ICD-10-CM | POA: Diagnosis not present

## 2023-01-30 DIAGNOSIS — K219 Gastro-esophageal reflux disease without esophagitis: Secondary | ICD-10-CM | POA: Diagnosis not present

## 2023-03-10 DIAGNOSIS — R946 Abnormal results of thyroid function studies: Secondary | ICD-10-CM | POA: Diagnosis not present

## 2023-03-10 DIAGNOSIS — J449 Chronic obstructive pulmonary disease, unspecified: Secondary | ICD-10-CM | POA: Diagnosis not present

## 2023-03-10 LAB — TSH: TSH: 0.45 (ref 0.41–5.90)

## 2023-06-27 ENCOUNTER — Encounter: Payer: Self-pay | Admitting: "Endocrinology

## 2023-06-27 ENCOUNTER — Ambulatory Visit: Payer: Medicare Other | Admitting: "Endocrinology

## 2023-06-27 VITALS — BP 114/82 | HR 76 | Ht 67.0 in | Wt 102.8 lb

## 2023-06-27 DIAGNOSIS — F17200 Nicotine dependence, unspecified, uncomplicated: Secondary | ICD-10-CM

## 2023-06-27 DIAGNOSIS — E041 Nontoxic single thyroid nodule: Secondary | ICD-10-CM

## 2023-06-27 DIAGNOSIS — R7989 Other specified abnormal findings of blood chemistry: Secondary | ICD-10-CM | POA: Diagnosis not present

## 2023-06-27 NOTE — Progress Notes (Signed)
Endocrinology Consult Note                                            06/27/2023, 10:28 AM   Subjective:    Patient ID: Elizabeth Dickerson, female    DOB: 01/24/1951, PCP Catalina Lunger, DO   Past Medical History:  Diagnosis Date   Anxiety    panic attack    Hypertension    Past Surgical History:  Procedure Laterality Date   BIOPSY  12/13/2019   Procedure: BIOPSY;  Surgeon: West Bali, MD;  [facility];  Service: Endoscopy;;  gastric   COLONOSCOPY N/A 12/13/2019   Procedure: COLONOSCOPY;  Surgeon: West Bali, MD;  [facility];  Service: Endoscopy;  Laterality: N/A;  7:30am   ESOPHAGOGASTRODUODENOSCOPY N/A 12/13/2019   Procedure: ESOPHAGOGASTRODUODENOSCOPY (EGD);  Surgeon: West Bali, MD;  [facility];  Service: Endoscopy;  Laterality: N/A;   POLYPECTOMY  12/13/2019   Procedure: POLYPECTOMY;  Surgeon: West Bali, MD;  [facility];  Service: Endoscopy;;  cecal, ascending, descending, sigmoid,splenic flexure   TUBAL LIGATION     Social History   Socioeconomic History   Marital status: Widowed    Spouse name: Not on file   Number of children: 3   Years of education: Not on file   Highest education level: Not on file  Occupational History   Occupation: retired  Tobacco Use   Smoking status: Every Day    Current packs/day: 0.10    Average packs/day: 0.1 packs/day for 30.0 years (3.0 ttl pk-yrs)    Types: Cigarettes   Smokeless tobacco: Never  Vaping Use   Vaping status: Never Used  Substance and Sexual Activity   Alcohol use: Not Currently   Drug use: Yes    Frequency: 0.5 times per week    Types: Marijuana    Comment: Marijuana about twice a month (as of 11/28/19)   Sexual activity: Not on file  Other Topics Concern   Not on file  Social History Narrative   Baby-sits great grandchild   Son lives with her   Daughters live in [place]   Social Determinants of Health   Financial Resource Strain: Low  Risk  (11/18/2022)   Received from [facility], [facility]   Overall Financial Resource Strain (CARDIA)    Difficulty of Paying Living Expenses: Not very hard  Food Insecurity: No Food Insecurity (11/18/2022)   Received from [facility], [facility]   Hunger Vital Sign    Worried About Running Out of Food in the Last Year: Never true    Ran Out of Food in the Last Year: Never true  Transportation Needs: No Transportation Needs (11/18/2022)   Received from [facility], [facility]   PRAPARE - Transportation    Lack of Transportation (Medical): No    Lack of Transportation (Non-Medical): No  Physical Activity: Inactive (10/15/2021)   Exercise Vital Sign    Days of Exercise per Week: 0 days    Minutes of Exercise per Session: 0 min  Stress: No Stress Concern Present (10/15/2021)   [employer] of Occupational Health - Occupational Stress Questionnaire    Feeling of Stress : Only a little  Social Connections: Unknown (02/06/2022)   Received from [facility], [facility]  Social Network    Social Network: Not on file   Family History  Problem Relation Age of Onset   Hearing loss Brother    Cancer Sister        breast   Colon cancer Neg Hx    Gastric cancer Neg Hx    Esophageal cancer Neg Hx    Outpatient Encounter Medications as of 06/27/2023  Medication Sig   omeprazole (PRILOSEC) 20 MG capsule Take 1 capsule by mouth daily.   albuterol (VENTOLIN HFA) 108 (90 Base) MCG/ACT inhaler Inhale 2 puffs into the lungs every 6 (six) hours as needed for wheezing or shortness of breath.   amLODipine (NORVASC) 10 MG tablet Take 10 mg by mouth daily.   bisoprolol (ZEBETA) 5 MG tablet Take 1 tablet (5 mg total) by mouth daily. (Patient not taking: Reported on 06/27/2023)   escitalopram (LEXAPRO) 5 MG tablet Take 5 mg by mouth daily.   Fluticasone-Umeclidin-Vilant (TRELEGY ELLIPTA) 100-62.5-25 MCG/ACT AEPB 1 click each am   megestrol (MEGACE) 40 MG tablet Take 1  tablet by mouth daily. (Patient not taking: Reported on 06/27/2023)   [DISCONTINUED] potassium chloride SA (KLOR-CON M) 20 MEQ tablet Take 1 tablet (20 mEq total) by mouth 2 (two) times daily for 5 days, THEN 1 tablet (20 mEq total) daily for 25 days. Kdur 20 meq take two daily x 5 days then one daily and recheck bmet in 2 weeks.   [DISCONTINUED] predniSONE (DELTASONE) 10 MG tablet Take  4 each am x 2 days,   2 each am x 2 days,  1 each am x 2 days and stop   [DISCONTINUED] UNABLE TO FIND Med Name: Metoprolol PO unsure of dosage   No facility-administered encounter medications on file as of 06/27/2023.   ALLERGIES: No Known Allergies  VACCINATION STATUS:  There is no immunization history on file for this patient.  HPI Elizabeth Dickerson is 72 y.o. female who presents today with a medical history as above. she is being seen in consultation for abnormal thyroid function tests and history of toxic thyroid nodule requested by Catalina Lunger, DO.  Patient is a suboptimal historian.  History is obtained mostly from chart review. Her record shows that she underwent full workup for her thyroid nodule in January 2023 with fine-needle aspiration biopsy revealing benign follicular nodule Bethesda category 2. She was observed to have fluctuating thyroid function tests, mostly suppressed TSH which did not require antithyroid intervention.  Most recently on March 10, 2023 her TSH was low normal at 0.454.  No free T4 was measured at that time.  A year earlier on March 24, 2022 her free T4 was normal at 1.15 associated with suppressed TSH of 0.099. Patient reports to be light build most of her adult life, weight under 100 pounds for decades. Sometime the last couple of years, she was given Megace to stimulate her appetite, gained weight up to 110 pounds.  Recently she is losing to her current weight of 102 pounds with a BMI of 16.1. She does not have acute complaints at the moment.  She is not on beta-blockers. She  does not have complaints of palpitations, tremors, nor heat intolerance. She denies dysphagia, shortness of breath, nor voice change. She is a chronic smoker.  She is bronchodilators.  She is also on treatment for hypertension. She has not taken Megace for several months.  She denies family history of thyroid dysfunction or thyroid malignancy.  Review of Systems  Constitutional: + fluctuating body weight  with recent weight loss, no fatigue, no subjective hyperthermia, no subjective hypothermia Eyes: no blurry vision, no xerophthalmia ENT: no sore throat, no nodules palpated in throat, no dysphagia/odynophagia, no hoarseness Cardiovascular: no Chest Pain, no Shortness of Breath, no palpitations, no leg swelling Respiratory: no cough, no shortness of breath Gastrointestinal: no Nausea/Vomiting/Diarhhea Musculoskeletal: no muscle/joint aches Skin: no rashes Neurological: no tremors, no numbness, no tingling, no dizziness Psychiatric: no depression, no anxiety  Objective:       06/27/2023    9:10 AM 10/25/2022    8:50 AM 10/15/2021    9:46 AM  Vitals with BMI  Height 5\' 7"  5\' 7"  5\' 7"   Weight 102 lbs 13 oz 89 lbs 13 oz 95 lbs  BMI 16.1 14.06 14.88  Systolic 114 132   Diastolic 82 82   Pulse 76 92     BP 114/82   Pulse 76   Ht 5\' 7"  (1.702 m)   Wt 102 lb 12.8 oz (46.6 kg)   BMI 16.10 kg/m   Wt Readings from Last 3 Encounters:  06/27/23 102 lb 12.8 oz (46.6 kg)  10/25/22 89 lb 12.8 oz (40.7 kg)  10/15/21 95 lb (43.1 kg)    Physical Exam  Constitutional:  Body mass index is 16.1 kg/m.,  not in acute distress, normal state of mind Eyes: PERRLA, EOMI, no exophthalmos ENT: moist mucous membranes, + gross thyromegaly, no gross cervical lymphadenopathy Cardiovascular: normal precordial activity, Regular Rate and Rhythm, no Murmur/Rubs/Gallops Respiratory:  adequate breathing efforts, no gross chest deformity, Clear to auscultation bilaterally Gastrointestinal: abdomen soft,  Non -tender, No distension, Bowel Sounds present, no gross organomegaly Musculoskeletal: no gross deformities, strength intact in all four extremities, no peripheral edema Skin: moist, warm, no rashes Neurological: no tremor with outstretched hands, Deep tendon reflexes normal in bilateral lower extremities.  CMP ( most recent) CMP     Component Value Date/Time   NA 142 10/25/2022 0940   K 2.7 (L) 10/25/2022 0940   CL 102 10/25/2022 0940   CO2 20 10/25/2022 0940   GLUCOSE 123 (H) 10/25/2022 0940   GLUCOSE 115 (H) 09/03/2021 0140   BUN 14 10/25/2022 0940   CREATININE 0.91 10/25/2022 0940   CALCIUM 9.3 10/25/2022 0940   PROT 7.0 09/03/2021 0140   PROT 7.2 10/28/2019 1543   ALBUMIN 3.7 09/03/2021 0140   ALBUMIN 4.7 10/28/2019 1543   AST 26 09/03/2021 0140   ALT 17 09/03/2021 0140   ALKPHOS 81 09/03/2021 0140   BILITOT 1.0 09/03/2021 0140   BILITOT 0.3 10/28/2019 1543   EGFR 68 10/25/2022 0940   GFRNONAA 60 (L) 09/03/2021 0140     Lipid Panel ( most recent) Lipid Panel     Component Value Date/Time   CHOL 185 10/28/2019 1543   TRIG 93 10/28/2019 1543   HDL 56 10/28/2019 1543   CHOLHDL 3.3 10/28/2019 1543   LDLCALC 112 (H) 10/28/2019 1543   LABVLDL 17 10/28/2019 1543      Lab Results  Component Value Date   TSH 0.45 03/10/2023   TSH 0.10 (A) 03/24/2022   TSH 0.961 10/28/2019   TSH 0.566 10/16/2018    Thyroid ultrasound on September 15, 2021 IMPRESSION: Nodule 3 (TI-RADS 3), measuring 3.9 x 3.4 x 2.4 cm, located in the inferior right thyroid lobe meets criteria for FNA.   Fine-needle aspiration biopsy on September 29, 2021 Specimen Submitted:  A. THYROID, RLL, FINE NEEDLE ASPIRATION:   FINAL MICROSCOPIC DIAGNOSIS:  - Consistent with benign follicular nodule (Bethesda  category II)   SPECIMEN ADEQUACY:  Satisfactory for evaluation    Assessment & Plan:   1. Abnormal thyroid blood test 2. Thyroid nodule  - Elizabeth Dickerson  is being seen at a kind request of  Catalina Lunger, DO. - I have reviewed her available thyroid records and clinically evaluated the patient. - Based on these reviews, she has 3.9 cm right thyroid lobe nodule which was biopsied in January 2023 with benign findings.  She continues to have fluctuating thyroid function tests, mostly suppressed TSH. This information is not sufficient to proceed with definitive antithyroid intervention.  She will be sent for repeat full set thyroid function tests to help decide if she needs thyroid function and intervention.  She will not need further workup for thyroid nodule.  The other nodules were described as benign.  She will be considered for repeat thyroid ultrasound only if she develops increasing clinical goiter or compressive symptoms.  -History of treatment with Megace puts patients at risk of adrenal insufficiency.  Her labs will include a.m. cortisol to assess her adrenal status.  The patient was counseled on the dangers of tobacco use, and was advised to quit.  Reviewed strategies to maximize success, including removing cigarettes and smoking materials from environment.   - I did not initiate any new prescriptions today. - she is advised to maintain close follow up with Catalina Lunger, DO for primary care needs.   -Thank you for involving me in the care of this pleasant patient.  Time spent with the patient: 46  minutes, of which >50% was spent in  counseling her about her abnormal thyroid function test, history of thyroid nodule and the rest in obtaining information about her symptoms, reviewing her previous labs/studies ( including abstractions from other facilities),  evaluations, and treatments,  and developing a plan to confirm diagnosis and long term treatment based on the latest standards of care/guidelines; and documenting her care.  Elizabeth Dickerson participated in the discussions, expressed understanding, and voiced agreement with the above plans.  All questions were answered to  her satisfaction. she is encouraged to contact clinic should she have any questions or concerns prior to her return visit.  Follow up plan: Return in about 2 weeks (around 07/11/2023) for Fasting Labs  in AM B4 8.   Elizabeth Lunch, MD [facility] [address] [place], [place] [address] Phone: [phone]  Fax: [phone]     06/27/2023, 10:28 AM  This note was partially dictated with voice recognition software. Similar sounding words can be transcribed inadequately or may not  be corrected upon review.

## 2023-07-03 LAB — CORTISOL-AM, BLOOD: Cortisol - AM: 12.1 ug/dL (ref 6.2–19.4)

## 2023-07-03 LAB — T4, FREE: Free T4: 1.24 ng/dL (ref 0.82–1.77)

## 2023-07-03 LAB — TSH: TSH: 0.748 u[IU]/mL (ref 0.450–4.500)

## 2023-07-03 LAB — THYROID PEROXIDASE ANTIBODY: Thyroperoxidase Ab SerPl-aCnc: 13 [IU]/mL (ref 0–34)

## 2023-07-03 LAB — THYROGLOBULIN ANTIBODY: Thyroglobulin Antibody: <1 [IU]/mL (ref 0.0–0.9)

## 2023-07-03 LAB — T3, FREE: T3, Free: 2.9 pg/mL (ref 2.0–4.4)

## 2023-07-12 ENCOUNTER — Ambulatory Visit: Payer: Medicare Other | Admitting: "Endocrinology

## 2023-08-03 ENCOUNTER — Encounter: Payer: Self-pay | Admitting: "Endocrinology

## 2023-08-03 ENCOUNTER — Ambulatory Visit: Payer: Medicare Other | Admitting: "Endocrinology

## 2023-08-03 VITALS — BP 134/82 | HR 80 | Ht 67.0 in | Wt 102.2 lb

## 2023-08-03 DIAGNOSIS — E041 Nontoxic single thyroid nodule: Secondary | ICD-10-CM

## 2023-08-03 DIAGNOSIS — R7989 Other specified abnormal findings of blood chemistry: Secondary | ICD-10-CM

## 2023-08-03 NOTE — Progress Notes (Signed)
08/03/2023, 7:09 PM   Endocrinology follow-up note  Subjective:    Patient ID: Elizabeth Dickerson, female    DOB: [birth year], PCP Catalina Lunger, DO   Past Medical History:  Diagnosis Date   Anxiety    panic attack    Hypertension    Past Surgical History:  Procedure Laterality Date   BIOPSY  12/13/2019   Procedure: BIOPSY;  Surgeon: West Bali, MD;  [facility];  Service: Endoscopy;;  gastric   COLONOSCOPY N/A 12/13/2019   Procedure: COLONOSCOPY;  Surgeon: West Bali, MD;  [facility];  Service: Endoscopy;  Laterality: N/A;  7:30am   ESOPHAGOGASTRODUODENOSCOPY N/A 12/13/2019   Procedure: ESOPHAGOGASTRODUODENOSCOPY (EGD);  Surgeon: West Bali, MD;  [facility];  Service: Endoscopy;  Laterality: N/A;   POLYPECTOMY  12/13/2019   Procedure: POLYPECTOMY;  Surgeon: West Bali, MD;  [facility];  Service: Endoscopy;;  cecal, ascending, descending, sigmoid,splenic flexure   TUBAL LIGATION     Social History   Socioeconomic History   Marital status: Widowed    Spouse name: Not on file   Number of children: 3   Years of education: Not on file   Highest education level: Not on file  Occupational History   Occupation: retired  Tobacco Use   Smoking status: Every Day    Current packs/day: 0.10    Average packs/day: 0.1 packs/day for 30.0 years (3.0 ttl pk-yrs)    Types: Cigarettes   Smokeless tobacco: Never  Vaping Use   Vaping status: Never Used  Substance and Sexual Activity   Alcohol use: Not Currently   Drug use: Yes    Frequency: 0.5 times per week    Types: Marijuana    Comment: Marijuana about twice a month (as of 11/28/19)   Sexual activity: Not on file  Other Topics Concern   Not on file  Social History Narrative   Baby-sits great grandchild   Son lives with her   Daughters live in [place]   Social Determinants of Health   Financial Resource Strain: Low  Risk  (06/29/2023)   Received from [facility]   Overall Financial Resource Strain (CARDIA)    Difficulty of Paying Living Expenses: Not hard at all  Food Insecurity: No Food Insecurity (06/29/2023)   Received from [facility]   Hunger Vital Sign    Worried About Running Out of Food in the Last Year: Never true    Ran Out of Food in the Last Year: Never true  Transportation Needs: No Transportation Needs (06/29/2023)   Received from [facility] - Transportation    Lack of Transportation (Medical): No    Lack of Transportation (Non-Medical): No  Physical Activity: Inactive (10/15/2021)   Exercise Vital Sign    Days of Exercise per Week: 0 days    Minutes of Exercise per Session: 0 min  Stress: No Stress Concern Present (06/29/2023)   Received from [facility] of Occupational Health - Occupational Stress Questionnaire    Feeling of Stress : Only a little  Social Connections: Unknown (06/29/2023)   Received from [facility]   Social  Connection and Isolation Panel [NHANES]    Frequency of Communication with Friends and Family: More than three times a week    Frequency of Social Gatherings with Friends and Family: More than three times a week    Attends Religious Services: More than 4 times per year    Active Member of [employer] or Organizations: No    Attends Engineer, structural: 1 to 4 times per year    Marital Status: Not on file   Family History  Problem Relation Age of Onset   Hearing loss Brother    Cancer Sister        breast   Colon cancer Neg Hx    Gastric cancer Neg Hx    Esophageal cancer Neg Hx    Outpatient Encounter Medications as of 08/03/2023  Medication Sig   albuterol (VENTOLIN HFA) 108 (90 Base) MCG/ACT inhaler Inhale 2 puffs into the lungs every 6 (six) hours as needed for wheezing or shortness of breath.   amLODipine (NORVASC) 10 MG tablet Take 10 mg by mouth daily.   bisoprolol (ZEBETA) 5 MG tablet Take 1  tablet (5 mg total) by mouth daily. (Patient not taking: Reported on 06/27/2023)   escitalopram (LEXAPRO) 5 MG tablet Take 5 mg by mouth daily.   Fluticasone-Umeclidin-Vilant (TRELEGY ELLIPTA) 100-62.5-25 MCG/ACT AEPB 1 click each am   omeprazole (PRILOSEC) 20 MG capsule Take 1 capsule by mouth daily.   No facility-administered encounter medications on file as of 08/03/2023.   ALLERGIES: No Known Allergies  VACCINATION STATUS:  There is no immunization history on file for this patient.  HPI KENETHA COZZA is 72 y.o. female who presents today with a medical history as above. she is being seen in follow-up after she was seen consultation for abnormal thyroid function tests and history of toxic thyroid nodule requested by Catalina Lunger, DO.  Patient is a suboptimal historian.    She was sent for repeat set of thyroid function test which are normal. Her record shows that she underwent full workup for her thyroid nodule in January 2023 with fine-needle aspiration biopsy revealing benign follicular nodule Bethesda category 2. She was observed to have fluctuating thyroid function tests, mostly suppressed TSH which did not require antithyroid intervention.  Most recently on March 10, 2023 her TSH was low normal at 0.454.  No free T4 was measured at that time.  A year earlier on March 24, 2022 her free T4 was normal at 1.15 associated with suppressed TSH of 0.099. Patient reports to be light build most of her adult life, weight under 100 pounds for decades. Sometime the last couple of years, she was given Megace to stimulate her appetite, gained weight up to 110 pounds.  Recently she is losing to her current weight of 102 pounds with a BMI of 16.1. She does not have acute complaints at the moment.  She is not on beta-blockers. She does not have complaints of palpitations, tremors, nor heat intolerance. She denies dysphagia, shortness of breath, nor voice change. She is a chronic smoker.  She is  bronchodilators.  She is also on treatment for hypertension. She has not taken Megace for several months.  She denies family history of thyroid dysfunction or thyroid malignancy.  Review of Systems  Constitutional: + fluctuating body weight with recent weight loss, no fatigue, no subjective hyperthermia, no subjective hypothermia Eyes: no blurry vision, no xerophthalmia ENT: no sore throat, no nodules palpated in throat, no dysphagia/odynophagia, no hoarseness   Objective:  08/03/2023    2:22 PM 06/27/2023    9:10 AM 10/25/2022    8:50 AM  Vitals with BMI  Height 5\' 7"  5\' 7"  5\' 7"   Weight 102 lbs 3 oz 102 lbs 13 oz 89 lbs 13 oz  BMI 16 16.1 14.06  Systolic 134 114 161  Diastolic 82 82 82  Pulse 80 76 92    BP 134/82   Pulse 80   Ht 5\' 7"  (1.702 m)   Wt 102 lb 3.2 oz (46.4 kg)   BMI 16.01 kg/m   Wt Readings from Last 3 Encounters:  08/03/23 102 lb 3.2 oz (46.4 kg)  06/27/23 102 lb 12.8 oz (46.6 kg)  10/25/22 89 lb 12.8 oz (40.7 kg)    Physical Exam  Constitutional:  Body mass index is 16.01 kg/m.,  not in acute distress, normal state of mind Eyes: PERRLA, EOMI, no exophthalmos ENT: moist mucous membranes, + gross thyromegaly, no gross cervical lymphadenopathy Cardiovascular: normal precordial activity, Regular Rate and Rhythm, no Murmur/Rubs/Gallops   CMP ( most recent) CMP     Component Value Date/Time   NA 142 10/25/2022 0940   K 2.7 (L) 10/25/2022 0940   CL 102 10/25/2022 0940   CO2 20 10/25/2022 0940   GLUCOSE 123 (H) 10/25/2022 0940   GLUCOSE 115 (H) 09/03/2021 0140   BUN 14 10/25/2022 0940   CREATININE 0.91 10/25/2022 0940   CALCIUM 9.3 10/25/2022 0940   PROT 7.0 09/03/2021 0140   PROT 7.2 10/28/2019 1543   ALBUMIN 3.7 09/03/2021 0140   ALBUMIN 4.7 10/28/2019 1543   AST 26 09/03/2021 0140   ALT 17 09/03/2021 0140   ALKPHOS 81 09/03/2021 0140   BILITOT 1.0 09/03/2021 0140   BILITOT 0.3 10/28/2019 1543   EGFR 68 10/25/2022 0940   GFRNONAA 60  (L) 09/03/2021 0140     Lipid Panel ( most recent) Lipid Panel     Component Value Date/Time   CHOL 185 10/28/2019 1543   TRIG 93 10/28/2019 1543   HDL 56 10/28/2019 1543   CHOLHDL 3.3 10/28/2019 1543   LDLCALC 112 (H) 10/28/2019 1543   LABVLDL 17 10/28/2019 1543      Lab Results  Component Value Date   TSH 0.748 06/30/2023   TSH 0.45 03/10/2023   TSH 0.10 (A) 03/24/2022   TSH 0.961 10/28/2019   TSH 0.566 10/16/2018   FREET4 1.24 06/30/2023    Thyroid ultrasound on September 15, 2021 IMPRESSION: Nodule 3 (TI-RADS 3), measuring 3.9 x 3.4 x 2.4 cm, located in the inferior right thyroid lobe meets criteria for FNA.   Fine-needle aspiration biopsy on September 29, 2021 Specimen Submitted:  A. THYROID, RLL, FINE NEEDLE ASPIRATION:   FINAL MICROSCOPIC DIAGNOSIS:  - Consistent with benign follicular nodule (Bethesda category II)   SPECIMEN ADEQUACY:  Satisfactory for evaluation    Assessment & Plan:   1. Abnormal thyroid blood test 2. Thyroid nodule   - I have reviewed her  new and available thyroid records and clinically evaluated the patient. - Based on these reviews, she has 3.9 cm right thyroid lobe nodule which was biopsied in January 2023 with benign findings.    She will not need further workup for thyroid nodule.  The other nodules were described as benign.  She will be considered for repeat thyroid ultrasound only if she develops increasing clinical goiter or compressive symptoms. -Her repeat full set thyroid function test are consistent with euthyroid presentation.  She would not need any antithyroid intervention at  this time.  -History of treatment with Megace puts patients at risk of adrenal insufficiency.  Her labs will include a.m. cortisol to assess her adrenal status. The patient was counseled on the dangers of tobacco use, and was advised to quit.  Reviewed strategies to maximize success, including removing cigarettes and smoking materials from  environment.   - I did not initiate any new prescriptions today. - she is advised to maintain close follow up with Catalina Lunger, DO for primary care needs.  I spent  21  minutes in the care of the patient today including review of labs from Thyroid Function, CMP, and other relevant labs ; imaging/biopsy records (current and previous including abstractions from other facilities); face-to-face time discussing  her lab results and symptoms, medications doses, her options of short and long term treatment based on the latest standards of care / guidelines;   and documenting the encounter.  Elizabeth Dickerson  participated in the discussions, expressed understanding, and voiced agreement with the above plans.  All questions were answered to her satisfaction. she is encouraged to contact clinic should she have any questions or concerns prior to her return visit.   Follow up plan: Return in about 1 year (around 08/02/2024), or if symptoms worsen or fail to improve.   Marquis Lunch, MD [facility] [address] [place], [place] [address] Phone: [phone]  Fax: [phone]     08/03/2023, 7:09 PM  This note was partially dictated with voice recognition software. Similar sounding words can be transcribed inadequately or may not  be corrected upon review.
